# Patient Record
Sex: Male | Born: 1970 | Race: Black or African American | Hispanic: No | Marital: Married | Smoking: Never smoker
Health system: Southern US, Community
[De-identification: ages and names within clinical notes are randomized; demographics above are authoritative.]

## PROBLEM LIST (undated history)

## (undated) DIAGNOSIS — S069X9A Unspecified intracranial injury with loss of consciousness of unspecified duration, initial encounter: Secondary | ICD-10-CM

## (undated) DIAGNOSIS — S065XAA Traumatic subdural hemorrhage with loss of consciousness status unknown, initial encounter: Secondary | ICD-10-CM

## (undated) DIAGNOSIS — G060 Intracranial abscess and granuloma: Secondary | ICD-10-CM

## (undated) DIAGNOSIS — S065X9A Traumatic subdural hemorrhage with loss of consciousness of unspecified duration, initial encounter: Secondary | ICD-10-CM

## (undated) DIAGNOSIS — R51 Headache: Secondary | ICD-10-CM

## (undated) DIAGNOSIS — Z789 Other specified health status: Secondary | ICD-10-CM

## (undated) HISTORY — DX: Intracranial abscess and granuloma: G06.0

## (undated) HISTORY — PX: SHOULDER OPEN ROTATOR CUFF REPAIR: SHX2407

---

## 2008-12-15 ENCOUNTER — Emergency Department (HOSPITAL_COMMUNITY): Admission: EM | Admit: 2008-12-15 | Discharge: 2008-12-15 | Payer: Self-pay | Admitting: Emergency Medicine

## 2010-04-20 DIAGNOSIS — S069X1A Unspecified intracranial injury with loss of consciousness of 30 minutes or less, initial encounter: Secondary | ICD-10-CM

## 2010-04-20 DIAGNOSIS — S069X9A Unspecified intracranial injury with loss of consciousness of unspecified duration, initial encounter: Secondary | ICD-10-CM

## 2010-04-20 HISTORY — DX: Unspecified intracranial injury with loss of consciousness of 30 minutes or less, initial encounter: S06.9X1A

## 2010-04-20 HISTORY — DX: Unspecified intracranial injury with loss of consciousness of unspecified duration, initial encounter: S06.9X9A

## 2010-08-31 ENCOUNTER — Emergency Department (HOSPITAL_COMMUNITY): Payer: Self-pay

## 2010-08-31 ENCOUNTER — Emergency Department (HOSPITAL_COMMUNITY)
Admission: EM | Admit: 2010-08-31 | Discharge: 2010-08-31 | Payer: Self-pay | Attending: Emergency Medicine | Admitting: Emergency Medicine

## 2010-08-31 DIAGNOSIS — Y9229 Other specified public building as the place of occurrence of the external cause: Secondary | ICD-10-CM | POA: Insufficient documentation

## 2010-08-31 DIAGNOSIS — S0180XA Unspecified open wound of other part of head, initial encounter: Secondary | ICD-10-CM | POA: Insufficient documentation

## 2010-08-31 DIAGNOSIS — IMO0002 Reserved for concepts with insufficient information to code with codable children: Secondary | ICD-10-CM | POA: Insufficient documentation

## 2011-02-28 ENCOUNTER — Other Ambulatory Visit: Payer: Self-pay

## 2011-02-28 ENCOUNTER — Emergency Department (HOSPITAL_COMMUNITY)
Admission: EM | Admit: 2011-02-28 | Discharge: 2011-02-28 | Disposition: A | Discharge status: Other Acute Inpatient Hospital | Payer: 59 | Source: Home / Self Care | Attending: Emergency Medicine | Admitting: Emergency Medicine

## 2011-02-28 ENCOUNTER — Emergency Department (HOSPITAL_COMMUNITY)
Admission: EM | Admit: 2011-02-28 | Discharge: 2011-02-28 | Disposition: A | Discharge status: Home / Self Care | Payer: 59 | Attending: Emergency Medicine | Admitting: Emergency Medicine

## 2011-02-28 ENCOUNTER — Emergency Department (HOSPITAL_COMMUNITY): Payer: 59

## 2011-02-28 ENCOUNTER — Encounter (HOSPITAL_COMMUNITY): Payer: Self-pay | Admitting: *Deleted

## 2011-02-28 DIAGNOSIS — S065X9A Traumatic subdural hemorrhage with loss of consciousness of unspecified duration, initial encounter: Secondary | ICD-10-CM

## 2011-02-28 DIAGNOSIS — R51 Headache: Secondary | ICD-10-CM | POA: Insufficient documentation

## 2011-02-28 DIAGNOSIS — S065XAA Traumatic subdural hemorrhage with loss of consciousness status unknown, initial encounter: Secondary | ICD-10-CM

## 2011-02-28 DIAGNOSIS — R4701 Aphasia: Secondary | ICD-10-CM | POA: Insufficient documentation

## 2011-02-28 DIAGNOSIS — I6200 Nontraumatic subdural hemorrhage, unspecified: Secondary | ICD-10-CM | POA: Insufficient documentation

## 2011-02-28 DIAGNOSIS — I6203 Nontraumatic chronic subdural hemorrhage: Secondary | ICD-10-CM

## 2011-02-28 DIAGNOSIS — R5381 Other malaise: Secondary | ICD-10-CM | POA: Insufficient documentation

## 2011-02-28 DIAGNOSIS — R4789 Other speech disturbances: Secondary | ICD-10-CM | POA: Insufficient documentation

## 2011-02-28 DIAGNOSIS — R209 Unspecified disturbances of skin sensation: Secondary | ICD-10-CM | POA: Insufficient documentation

## 2011-02-28 DIAGNOSIS — R471 Dysarthria and anarthria: Secondary | ICD-10-CM | POA: Insufficient documentation

## 2011-02-28 LAB — CBC
MCH: 28.6 pg (ref 26.0–34.0)
MCHC: 34.8 g/dL (ref 30.0–36.0)
Platelets: 266 10*3/uL (ref 150–400)
RBC: 5.14 MIL/uL (ref 4.22–5.81)
RDW: 14 % (ref 11.5–15.5)

## 2011-02-28 LAB — COMPREHENSIVE METABOLIC PANEL
ALT: 19 U/L (ref 0–53)
AST: 31 U/L (ref 0–37)
Albumin: 4.5 g/dL (ref 3.5–5.2)
Calcium: 9.8 mg/dL (ref 8.4–10.5)
Sodium: 136 mEq/L (ref 135–145)
Total Protein: 7.5 g/dL (ref 6.0–8.3)

## 2011-02-28 MED ORDER — HYDROMORPHONE HCL PF 1 MG/ML IJ SOLN
INTRAMUSCULAR | Status: AC
  Administered 2011-02-28: 1 mg via INTRAVENOUS
  Filled 2011-02-28: qty 1

## 2011-02-28 MED ORDER — METOCLOPRAMIDE HCL 5 MG/ML IJ SOLN
10.0000 mg | Freq: Once | INTRAMUSCULAR | Status: AC
  Administered 2011-02-28: 10 mg via INTRAMUSCULAR
  Filled 2011-02-28: qty 2

## 2011-02-28 MED ORDER — OXYCODONE-ACETAMINOPHEN 5-325 MG PO TABS: 1.0000 | ORAL_TABLET | ORAL | Status: DC | PRN

## 2011-02-28 MED ORDER — OXYCODONE-ACETAMINOPHEN 5-325 MG PO TABS
1.0000 | ORAL_TABLET | Freq: Once | ORAL | Status: AC
Start: 2011-02-28 — End: 2011-02-28
  Administered 2011-02-28: 1 via ORAL
  Filled 2011-02-28: qty 1

## 2011-02-28 MED ORDER — HYDROMORPHONE HCL PF 1 MG/ML IJ SOLN
1.0000 mg | Freq: Once | INTRAMUSCULAR | Status: AC
  Administered 2011-02-28: 1 mg via INTRAVENOUS
  Filled 2011-02-28: qty 1

## 2011-02-28 MED ORDER — HYDROMORPHONE HCL PF 1 MG/ML IJ SOLN
1.0000 mg | Freq: Once | INTRAMUSCULAR | Status: AC
  Administered 2011-02-28: 1 mg via INTRAVENOUS

## 2011-02-28 NOTE — ED Notes (Signed)
[facility] at bedside. Pt remains a/o x 4, no neuro deficits. C/o right sided headache 8/10, MD aware

## 2011-02-28 NOTE — ED Notes (Addendum)
Trans from [employer]

## 2011-02-28 NOTE — Consult Note (Signed)
Reason for Consult: Headache right-sided tingling Referring Physician: Dr. Bednar  Don Palmer is an 39 y.o. male.  HPI: Patient is a 39-year-old male whose had two-month history of headaches. He went to urgent care and was diagnosed with migraine. Those medicines that did not help much he is continued with headaches worse with activity. Today had episode of right-sided arm and face tingling it is slight weakness. This resolved he presented to the emergency room CT scan of the head was done. CT showed left-sided subdural hematoma and patient transferred home for [facility] further care. Patient's no complaints at the moment.  PMH:negative   surg Hx - L rotator cuff  Family History: No pertinent family history.   Social History:  reports that he has never smoked. He does not have any smokeless tobacco history on file. He reports that he drinks alcohol. He reports that he does not use illicit drugs.  Allergies: No Known Allergies  Review of Systems  Constitutional: Negative for fever.  10 Systems reviewed and are negative for acute change except as noted in the HPI.  HENT: Negative for congestion.  Eyes: Negative for discharge and redness.  Respiratory: Negative for cough and shortness of breath.  Cardiovascular: Negative for chest pain.  Gastrointestinal: Negative for vomiting and abdominal pain.  Musculoskeletal: Negative for back pain.  Skin: Negative for rash.  Neurological: Positive for speech difficulty, weakness, numbness and headaches. Negative for syncope.  Psychiatric/Behavioral:  No behavior change.    Medications:  .  KETOROLAC TROMETHAMINE 10 MG PO TABS  Oral  Take 10 mg by mouth every 6 (six) hours as needed. For migraines    Results for orders placed during the hospital encounter of 02/28/11 (from the past 48 hour(s))  CBC     Status: Normal   Collection Time   02/28/11  4:26 PM      Component Value Range Comment   WBC 8.6  4.0 - 10.5 (K/uL)    RBC 5.14  4.22 - 5.81 (MIL/uL)    Hemoglobin 14.7  13.0 - 17.0 (g/dL)    HCT 42.2  39.0 - 52.0 (%)    MCV 82.1  78.0 - 100.0 (fL)    MCH 28.6  26.0 - 34.0 (pg)    MCHC 34.8  30.0 - 36.0 (g/dL)    RDW 14.0  11.5 - 15.5 (%)    Platelets 266  150 - 400 (K/uL)   COMPREHENSIVE METABOLIC PANEL     Status: Abnormal   Collection Time   02/28/11  4:26 PM      Component Value Range Comment   Sodium 136  135 - 145 (mEq/L)    Potassium 3.0 (*) 3.5 - 5.1 (mEq/L)    Chloride 100  96 - 112 (mEq/L)    CO2 27  19 - 32 (mEq/L)    Glucose, Bld 83  70 - 99 (mg/dL)    BUN 13  6 - 23 (mg/dL)    Creatinine, Ser 1.17  0.50 - 1.35 (mg/dL)    Calcium 9.8  8.4 - 10.5 (mg/dL)    Total Protein 7.5  6.0 - 8.3 (g/dL)    Albumin 4.5  3.5 - 5.2 (g/dL)    AST 31  0 - 37 (U/L)    ALT 19  0 - 53 (U/L)    Alkaline Phosphatase 57  39 - 117 (U/L)    Total Bilirubin 0.4  0.3 - 1.2 (mg/dL)    GFR calc non Af Amer 76 (*) >  90 (mL/min)    GFR calc Af Amer 88 (*) >90 (mL/min)   PROTIME-INR     Status: Normal   Collection Time   02/28/11  4:26 PM      Component Value Range Comment   Prothrombin Time 13.4  11.6 - 15.2 (seconds)    INR 1.00  0.00 - 1.49      Ct Head Wo Contrast  02/28/2011  *RADIOLOGY REPORT*  Clinical Data: Dysarthria, weakness  CT HEAD WITHOUT CONTRAST  Technique:  Contiguous axial images were obtained from the base of the skull through the vertex without contrast.  Comparison: CT 08/31/2010  Findings: There is a large intermediate density extra-axial fluid collection extending along the left cerebral convexity from the frontal lobe to the parietal lobe.  This fluid collection is 1.5 cm in greatest depth and is approximately isointense to gray matter. There is mass effect upon the left frontal lobe with effacement of the cortex and compression of the cortex.  There is significant rightward midline shift of 11 mm.  The left lateral ventricle is compressed.  No significant hydrocephalus.  The quadrigeminal  plates are mildly effaced.  The fourth ventricle is patent.  The suprasellar cistern is patent.  No evidence of skull fracture.  Paranasal sinuses mastoid air cells are clear.  IMPRESSION:  1.  Large intermediate density extra-axial fluid collection is most consistent with a subacute subdural hematoma.   2. Significant mass effect with compression of the left frontal lobe and 10 mm of rightward  midline shift.  Findings conveyed to Dr. Bender on 02/28/2011 at 1635 hours  Original Report Authenticated By: STEWART EDMUNDS, M.D.    PE Blood pressure 104/73, pulse 69, resp. rate 16, height 5' 7" (1.702 m), weight 83.915 kg (185 lb), SpO2 99.00%. Patient awake alert oriented x3. Normal fund of knowledge. Speech fluent. Cranial nerves II through XII intact. Motor strength shows 5 over 5 strength in all motor groups. Sensation intact throughout. No pronator drift. Deep tendon reflexes symmetric. No sensory extinction.  Neck is supple nontender. Head normocephalic. Lungs clear abdomen soft nontender extremities intact.   Assessment/Plan: Patient with large subacute subdural hematoma in the left. Patient is neurologically intact. We have planned on having surgery Monday morning.left craniotomy evacuation of subdural hematoma. Patient wishes to be discharged home and within the neurologically intact at this point I think it is fine and readmitted Monday for surgery.  Andreyah Natividad R, MD 02/28/2011, 8:00 PM   

## 2011-02-28 NOTE — ED Provider Notes (Signed)
History     CSN: [id number] Arrival date & time: 02/28/2011  3:07 PM   First MD Initiated Contact with Patient 02/28/11 1600      Chief Complaint  Patient presents with  . Headache  . Aphasia    pt in with h.a x 2 months with blurred vision and aphasia x 1 hr , states right hand numbness    (Consider location/radiation/quality/duration/timing/severity/associated sxs/prior treatment) HPI This 40 year old male has had a gradual onset of headache on and off for the last few weeks. A few weeks ago he had a gradual onset of a headache the last week and then was gone for a week. The headache returned for about a week and then resolved as well. He has been headache free for several days but now today has a gradual onset about an hour prior to arrival to the hospital with a headache again as well as transient 20 minute spell of right-sided arm and leg numbness and weakness which is now resolved completely. He also had transient speech difficulty for about 20 minutes prior to arrival today as well. He was last seen normal 1 hour prior to arrival that he had focal neurologic symptoms temporarily for 20 minutes then it does resolve completely prior to arrival. Upon arrival he now is headache free as his headache completely resolved prior to arrival. There is no change in swallowing or understanding or vision.  He is not a candidate for TPA and stroke upon arrival as his symptoms resolved completely prior to arrival. History reviewed. No pertinent past medical history.  History reviewed. No pertinent past surgical history.  History reviewed. No pertinent family history.  History  Substance Use Topics  . Smoking status: Never Smoker   . Smokeless tobacco: Not on file  . Alcohol Use: Yes      Review of Systems  Constitutional: Negative for fever.       10 Systems reviewed and are negative for acute change except as noted in the HPI.  HENT: Negative for congestion.   Eyes: Negative for  discharge and redness.  Respiratory: Negative for cough and shortness of breath.   Cardiovascular: Negative for chest pain.  Gastrointestinal: Negative for vomiting and abdominal pain.  Musculoskeletal: Negative for back pain.  Skin: Negative for rash.  Neurological: Positive for speech difficulty, weakness, numbness and headaches. Negative for syncope.  Psychiatric/Behavioral:       No behavior change.    Allergies  Review of patient's allergies indicates no known allergies.  Home Medications   Current Outpatient Rx  Name Route Sig Dispense Refill  . KETOROLAC TROMETHAMINE 10 MG PO TABS Oral Take 10 mg by mouth every 6 (six) hours as needed. For migraines       BP 142/65  Pulse 68  Temp(Src) 98.8 F (37.1 C) (Oral)  Resp 18  SpO2 99%  Physical Exam  Nursing note and vitals reviewed. Constitutional: He appears well-developed.       Awake, alert, nontoxic appearance with baseline speech for patient.  HENT:  Head: Atraumatic.  Mouth/Throat: No oropharyngeal exudate.  Eyes: EOM are normal. Pupils are equal, round, and reactive to light. Right eye exhibits no discharge. Left eye exhibits no discharge.  Neck: Neck supple.  Cardiovascular: Normal rate and regular rhythm.   No murmur heard. Pulmonary/Chest: Effort normal and breath sounds normal. No stridor. No respiratory distress. He has no wheezes. He has no rales. He exhibits no tenderness.  Abdominal: Soft. Bowel sounds are normal. He exhibits no  mass. There is no tenderness. There is no rebound.  Musculoskeletal: Normal range of motion. He exhibits no edema and no tenderness.       Baseline ROM, moves extremities with no obvious new focal weakness.  Lymphadenopathy:    He has no cervical adenopathy.  Neurological: He is alert.       Awake, alert, cooperative and aware of situation; motor strength bilaterally; sensation normal to light touch bilaterally; peripheral visual fields full to confrontation; no facial asymmetry;  tongue midline; major cranial nerves appear intact; no pronator drift, normal finger to nose bilaterally  Skin: No rash noted.  Psychiatric: He has a normal mood and affect.    ED Course  Procedures (including critical care time)  ECG: Sinus rhythm, ventricular rate 84 beats per minute, normal axis, normal intervals, nonspecific T wave changes present, no comparison ECG available.  D/w NSurg for transfer to [facility].  Pt stable in ED with no significant deterioration in condition.Patient / Family / Caregiver informed of clinical course, understand medical decision-making process, and agree with plan.  CRITICAL CARE Performed by: Hurman Horn   Total critical care time: 40  Critical care time was exclusive of separately billable procedures and treating other patients.  Critical care was necessary to treat or prevent imminent or life-threatening deterioration.  Critical care was time spent personally by me on the following activities: development of treatment plan with patient and/or surrogate as well as nursing, discussions with consultants, evaluation of patient's response to treatment, examination of patient, obtaining history from patient or surrogate, ordering and performing treatments and interventions, ordering and review of laboratory studies, ordering and review of radiographic studies, pulse oximetry and re-evaluation of patient's condition.  Labs Reviewed  CBC  PROTIME-INR  COMPREHENSIVE METABOLIC PANEL   Ct Head Wo Contrast  02/28/2011  *RADIOLOGY REPORT*  Clinical Data: Dysarthria, weakness  CT HEAD WITHOUT CONTRAST  Technique:  Contiguous axial images were obtained from the base of the skull through the vertex without contrast.  Comparison: CT 08/31/2010  Findings: There is a large intermediate density extra-axial fluid collection extending along the left cerebral convexity from the frontal lobe to the parietal lobe.  This fluid collection is 1.5 cm in greatest depth and is  approximately isointense to gray matter. There is mass effect upon the left frontal lobe with effacement of the cortex and compression of the cortex.  There is significant rightward midline shift of 11 mm.  The left lateral ventricle is compressed.  No significant hydrocephalus.  The quadrigeminal plates are mildly effaced.  The fourth ventricle is patent.  The suprasellar cistern is patent.  No evidence of skull fracture.  Paranasal sinuses mastoid air cells are clear.  IMPRESSION:  1.  Large intermediate density extra-axial fluid collection is most consistent with a subacute subdural hematoma.   2. Significant mass effect with compression of the left frontal lobe and 10 mm of rightward  midline shift.  Findings conveyed to Dr. Hessie Diener on 02/28/2011 at 1635 hours  Original Report Authenticated By: Genevive Bi, M.D.     1. Subdural hematoma       MDM  The patient appears reasonably stabilized for transfer considering the current resources, flow, and capabilities available in the ED at this time, and I doubt any other [facility] requiring further screening and/or treatment in the ED prior to transfer.        Hurman Horn, MD 03/01/11 [phone]

## 2011-02-28 NOTE — ED Notes (Signed)
Dr Fonnie Jarvis at bedside, pt a/o x 4, very restless, reassurance offered to pt. Wife at bedside. Dr Fonnie Jarvis updated pt and wife on CT scan results

## 2011-02-28 NOTE — ED Notes (Signed)
Advised RN at [facility] ED of pt's pending arrival.

## 2011-02-28 NOTE — ED Notes (Signed)
Pt transported to and from CT scan via stretcher with tech  

## 2011-02-28 NOTE — ED Provider Notes (Signed)
Pt seen by [name] Will have surgery Monday He is well appearing, no distress, watching TV He understands to come back to hospital   Date: 02/28/2011  Rate: 73  Rhythm: normal sinus rhythm  QRS Axis: normal  Intervals: normal  ST/T Wave abnormalities: nonspecific ST changes  Conduction Disutrbances:none  Narrative Interpretation:   Old EKG Reviewed: none available    Joya Gaskins, MD 02/28/11 2211

## 2011-03-02 ENCOUNTER — Encounter (HOSPITAL_COMMUNITY): Payer: Self-pay | Admitting: *Deleted

## 2011-03-02 ENCOUNTER — Encounter (HOSPITAL_COMMUNITY): Payer: Self-pay | Admitting: Certified Registered"

## 2011-03-02 ENCOUNTER — Encounter (HOSPITAL_COMMUNITY)
Admission: RE | Disposition: A | Discharge status: Home / Self Care | Payer: Self-pay | Source: Ambulatory Visit | Attending: Neurosurgery

## 2011-03-02 ENCOUNTER — Other Ambulatory Visit: Payer: Self-pay | Admitting: Neurosurgery

## 2011-03-02 ENCOUNTER — Inpatient Hospital Stay (HOSPITAL_COMMUNITY)
Admission: RE | Admit: 2011-03-02 | Discharge: 2011-03-05 | DRG: 027 | Disposition: A | Discharge status: Home / Self Care | Payer: 59 | Source: Ambulatory Visit | Attending: Neurosurgery | Admitting: Neurosurgery

## 2011-03-02 ENCOUNTER — Encounter (HOSPITAL_COMMUNITY): Payer: Self-pay | Admitting: Neurology

## 2011-03-02 ENCOUNTER — Inpatient Hospital Stay (HOSPITAL_COMMUNITY): Payer: 59 | Admitting: Certified Registered"

## 2011-03-02 DIAGNOSIS — I6200 Nontraumatic subdural hemorrhage, unspecified: Principal | ICD-10-CM | POA: Diagnosis present

## 2011-03-02 DIAGNOSIS — S065X9A Traumatic subdural hemorrhage with loss of consciousness of unspecified duration, initial encounter: Secondary | ICD-10-CM

## 2011-03-02 HISTORY — PX: CRANIOTOMY: SHX93

## 2011-03-02 HISTORY — DX: Other specified health status: Z78.9

## 2011-03-02 HISTORY — DX: Headache: R51

## 2011-03-02 LAB — TYPE AND SCREEN: Antibody Screen: NEGATIVE

## 2011-03-02 LAB — ABO/RH: ABO/RH(D): O POS

## 2011-03-02 LAB — BASIC METABOLIC PANEL
BUN: 10 mg/dL (ref 6–23)
Chloride: 104 mEq/L (ref 96–112)
GFR calc Af Amer: 68 mL/min — ABNORMAL LOW (ref >90)
GFR calc non Af Amer: 59 mL/min — ABNORMAL LOW (ref >90)
Glucose, Bld: 133 mg/dL — ABNORMAL HIGH (ref 70–99)
Potassium: 4.2 mEq/L (ref 3.5–5.1)
Sodium: 140 mEq/L (ref 135–145)

## 2011-03-02 LAB — SURGICAL PCR SCREEN: Staphylococcus aureus: NEGATIVE

## 2011-03-02 SURGERY — CRANIOTOMY HEMATOMA EVACUATION SUBDURAL
Anesthesia: General | Laterality: Left | Wound class: Clean

## 2011-03-02 MED ORDER — ONDANSETRON HCL 4 MG/2ML IJ SOLN: 4.0000 mg | INTRAMUSCULAR | Status: DC | PRN

## 2011-03-02 MED ORDER — PHENOL 1.4 % MT LIQD: 1.0000 | OROMUCOSAL | Status: DC | PRN

## 2011-03-02 MED ORDER — PROPOFOL 10 MG/ML IV EMUL
INTRAVENOUS | Status: DC | PRN
  Administered 2011-03-02: 280 mg via INTRAVENOUS

## 2011-03-02 MED ORDER — SUFENTANIL CITRATE 50 MCG/ML IV SOLN
INTRAVENOUS | Status: DC | PRN
  Administered 2011-03-02: 20 ug via INTRAVENOUS
  Administered 2011-03-02: 10 ug via INTRAVENOUS
  Administered 2011-03-02: 20 ug via INTRAVENOUS

## 2011-03-02 MED ORDER — ALUM & MAG HYDROXIDE-SIMETH 400-400-40 MG/5ML PO SUSP
30.0000 mL | Freq: Four times a day (QID) | ORAL | Status: DC | PRN
  Filled 2011-03-02: qty 30

## 2011-03-02 MED ORDER — THROMBIN 20000 UNITS EX KIT
PACK | CUTANEOUS | Status: DC | PRN
  Administered 2011-03-02: 10:00:00 via TOPICAL

## 2011-03-02 MED ORDER — ONDANSETRON HCL 4 MG/2ML IJ SOLN: 4.0000 mg | Freq: Four times a day (QID) | INTRAMUSCULAR | Status: DC | PRN

## 2011-03-02 MED ORDER — CEFAZOLIN SODIUM 1-5 GM-% IV SOLN
1.0000 g | Freq: Three times a day (TID) | INTRAVENOUS | Status: DC
  Administered 2011-03-02 – 2011-03-04 (×7): 1 g via INTRAVENOUS
  Filled 2011-03-02 (×8): qty 50

## 2011-03-02 MED ORDER — LACTATED RINGERS IV SOLN
INTRAVENOUS | Status: DC | PRN
  Administered 2011-03-02 (×2): via INTRAVENOUS

## 2011-03-02 MED ORDER — ONDANSETRON HCL 4 MG/2ML IJ SOLN
INTRAMUSCULAR | Status: DC | PRN
  Administered 2011-03-02: 4 mg via INTRAVENOUS

## 2011-03-02 MED ORDER — ROCURONIUM BROMIDE 100 MG/10ML IV SOLN
INTRAVENOUS | Status: DC | PRN
  Administered 2011-03-02: 50 mg via INTRAVENOUS

## 2011-03-02 MED ORDER — LIDOCAINE-EPINEPHRINE 1 %-1:100000 IJ SOLN
INTRAMUSCULAR | Status: DC | PRN
  Administered 2011-03-02: 20 mL

## 2011-03-02 MED ORDER — KCL IN DEXTROSE-NACL 20-5-0.45 MEQ/L-%-% IV SOLN
INTRAVENOUS | Status: DC
  Administered 2011-03-02 – 2011-03-03 (×4): via INTRAVENOUS
  Filled 2011-03-02 (×6): qty 1000

## 2011-03-02 MED ORDER — SODIUM CHLORIDE 0.9 % IR SOLN
Status: DC | PRN
  Administered 2011-03-02: 10:00:00

## 2011-03-02 MED ORDER — MUPIROCIN 2 % EX OINT
TOPICAL_OINTMENT | Freq: Two times a day (BID) | CUTANEOUS | Status: DC
  Administered 2011-03-02 – 2011-03-04 (×3): via NASAL
  Filled 2011-03-02: qty 22

## 2011-03-02 MED ORDER — ACETAMINOPHEN 325 MG PO TABS: 650.0000 mg | ORAL_TABLET | ORAL | Status: DC | PRN

## 2011-03-02 MED ORDER — SODIUM CHLORIDE 0.9 % IR SOLN
Status: DC | PRN
  Administered 2011-03-02: 2000 mL

## 2011-03-02 MED ORDER — DOCUSATE SODIUM 100 MG PO CAPS
100.0000 mg | ORAL_CAPSULE | Freq: Two times a day (BID) | ORAL | Status: DC
  Administered 2011-03-02 – 2011-03-04 (×5): 100 mg via ORAL
  Filled 2011-03-02 (×8): qty 1

## 2011-03-02 MED ORDER — DEXAMETHASONE SODIUM PHOSPHATE 4 MG/ML IJ SOLN
INTRAMUSCULAR | Status: DC | PRN
  Administered 2011-03-02: 4 mg via INTRAVENOUS

## 2011-03-02 MED ORDER — CEFAZOLIN SODIUM-DEXTROSE 2-3 GM-% IV SOLR
2.0000 g | INTRAVENOUS | Status: AC
  Administered 2011-03-02: 2 g via INTRAVENOUS
  Filled 2011-03-02: qty 50

## 2011-03-02 MED ORDER — LABETALOL HCL 5 MG/ML IV SOLN
5.0000 mg | INTRAVENOUS | Status: DC | PRN
Start: 2011-03-02 — End: 2011-03-04

## 2011-03-02 MED ORDER — HYDROMORPHONE HCL PF 1 MG/ML IJ SOLN: 0.2500 mg | INTRAMUSCULAR | Status: DC | PRN

## 2011-03-02 MED ORDER — MORPHINE SULFATE 4 MG/ML IJ SOLN: 1.0000 mg | INTRAMUSCULAR | Status: DC | PRN

## 2011-03-02 MED ORDER — ACETAMINOPHEN 650 MG RE SUPP: 650.0000 mg | RECTAL | Status: DC | PRN

## 2011-03-02 MED ORDER — MIDAZOLAM HCL 5 MG/5ML IJ SOLN
INTRAMUSCULAR | Status: DC | PRN
  Administered 2011-03-02: 2 mg via INTRAVENOUS

## 2011-03-02 MED ORDER — BACITRACIN ZINC 500 UNIT/GM EX OINT
TOPICAL_OINTMENT | CUTANEOUS | Status: DC | PRN
  Administered 2011-03-02: 1 via TOPICAL

## 2011-03-02 MED ORDER — HYDROCODONE-ACETAMINOPHEN 5-325 MG PO TABS
1.0000 | ORAL_TABLET | ORAL | Status: DC | PRN
  Administered 2011-03-02 (×2): 1 via ORAL
  Administered 2011-03-03 (×4): 2 via ORAL
  Administered 2011-03-03: 1 via ORAL
  Administered 2011-03-04: 2 via ORAL
  Filled 2011-03-02: qty 2
  Filled 2011-03-02: qty 1
  Filled 2011-03-02: qty 2
  Filled 2011-03-02: qty 1
  Filled 2011-03-02 (×5): qty 2

## 2011-03-02 MED ORDER — MENTHOL 3 MG MT LOZG: 1.0000 | LOZENGE | OROMUCOSAL | Status: DC | PRN

## 2011-03-02 MED ORDER — PROMETHAZINE HCL 25 MG/ML IJ SOLN
12.5000 mg | INTRAMUSCULAR | Status: DC | PRN
  Filled 2011-03-02: qty 1

## 2011-03-02 SURGICAL SUPPLY — 45 items
BAG DECANTER FOR FLEXI CONT (MISCELLANEOUS) ×2 IMPLANT
BENZOIN TINCTURE PRP APPL 2/3 (GAUZE/BANDAGES/DRESSINGS) ×2 IMPLANT
BRUSH SCRUB EZ PLAIN DRY (MISCELLANEOUS) ×2 IMPLANT
BUR ACORN 6.0 PRECISION (BURR) ×2 IMPLANT
BUR ROUTER D-58 CRANI (BURR) ×2 IMPLANT
CANISTER SUCTION 2500CC (MISCELLANEOUS) ×2 IMPLANT
CLOTH BEACON ORANGE TIMEOUT ST (SAFETY) ×2 IMPLANT
CONT SPEC 4OZ CLIKSEAL STRL BL (MISCELLANEOUS) ×4 IMPLANT
CORDS BIPOLAR (ELECTRODE) ×2 IMPLANT
DRAIN JACKSON PRATT 10MM FLAT (MISCELLANEOUS) ×2 IMPLANT
DRAPE NEUROLOGICAL W/INCISE (DRAPES) ×2 IMPLANT
DRAPE WARM FLUID 44X44 (DRAPE) ×2 IMPLANT
DRESSING TELFA 8X3 (GAUZE/BANDAGES/DRESSINGS) ×2 IMPLANT
DRSG OPSITE 4X5.5 SM (GAUZE/BANDAGES/DRESSINGS) ×4 IMPLANT
ELECT CAUTERY BLADE 6.4 (BLADE) ×2 IMPLANT
ELECT NEEDLE TIP 2.8 STRL (NEEDLE) ×2 IMPLANT
ELECT REM PT RETURN 9FT ADLT (ELECTROSURGICAL) ×2
ELECTRODE REM PT RTRN 9FT ADLT (ELECTROSURGICAL) ×1 IMPLANT
EVACUATOR SILICONE 100CC (DRAIN) ×2 IMPLANT
GLOVE ECLIPSE 7.5 STRL STRAW (GLOVE) ×4 IMPLANT
GOWN BRE IMP SLV AUR LG STRL (GOWN DISPOSABLE) ×2 IMPLANT
GOWN BRE IMP SLV AUR XL STRL (GOWN DISPOSABLE) ×2 IMPLANT
GOWN STRL REIN 2XL LVL4 (GOWN DISPOSABLE) ×2 IMPLANT
HOOK DURA (MISCELLANEOUS) ×2 IMPLANT
KIT BASIN OR (CUSTOM PROCEDURE TRAY) ×2 IMPLANT
KIT ROOM TURNOVER OR (KITS) ×2 IMPLANT
NEEDLE HYPO 22GX1.5 SAFETY (NEEDLE) ×2 IMPLANT
NS IRRIG 1000ML POUR BTL (IV SOLUTION) ×10 IMPLANT
PACK CRANIOTOMY (CUSTOM PROCEDURE TRAY) ×2 IMPLANT
PAD ARMBOARD 7.5X6 YLW CONV (MISCELLANEOUS) ×4 IMPLANT
PATTIES SURGICAL .75X.75 (GAUZE/BANDAGES/DRESSINGS) ×2 IMPLANT
PLATE 1.5  2HOLE MED NEURO (Plate) ×1 IMPLANT
PLATE 1.5 2HOLE MED NEURO (Plate) ×1 IMPLANT
PLATE 1.5 5HOLE SQUARE (Plate) ×4 IMPLANT
SCREW SELF DRILL HT 1.5/4MM (Screw) ×20 IMPLANT
STAPLER SKIN PROX WIDE 3.9 (STAPLE) ×2 IMPLANT
SUT ETHILON 3 0 FSL (SUTURE) ×2 IMPLANT
SUT NURALON 4 0 TR CR/8 (SUTURE) ×4 IMPLANT
SUT VIC AB 2-0 CP2 18 (SUTURE) ×4 IMPLANT
SYR 20ML ECCENTRIC (SYRINGE) ×2 IMPLANT
SYR CONTROL 10ML LL (SYRINGE) ×2 IMPLANT
TOWEL OR 17X24 6PK STRL BLUE (TOWEL DISPOSABLE) ×2 IMPLANT
TOWEL OR 17X26 10 PK STRL BLUE (TOWEL DISPOSABLE) ×2 IMPLANT
UNDERPAD 30X30 INCONTINENT (UNDERPADS AND DIAPERS) ×2 IMPLANT
WATER STERILE IRR 1000ML POUR (IV SOLUTION) ×2 IMPLANT

## 2011-03-02 NOTE — Anesthesia Preprocedure Evaluation (Addendum)
Anesthesia Evaluation  Patient identified by MRN, date of birth, ID band Patient awake    Reviewed: Allergy & Precautions, H&P , NPO status , Patient's Chart, lab work & pertinent test results  Airway Mallampati: II  Neck ROM: full    Dental   Pulmonary          Cardiovascular     Neuro/Psych  Headaches,    GI/Hepatic   Endo/Other    Renal/GU      Musculoskeletal   Abdominal   Peds  Hematology   Anesthesia Other Findings   Reproductive/Obstetrics                          Anesthesia Physical Anesthesia Plan  ASA: II  Anesthesia Plan: General   Post-op Pain Management:    Induction: Intravenous  Airway Management Planned: Oral ETT  Additional Equipment:   Intra-op Plan:   Post-operative Plan:   Informed Consent: I have reviewed the patients History and Physical, chart, labs and discussed the procedure including the risks, benefits and alternatives for the proposed anesthesia with the patient or authorized representative who has indicated his/her understanding and acceptance.   Dental advisory given  Plan Discussed with: CRNA and Surgeon  Anesthesia Plan Comments:        Anesthesia Quick Evaluation

## 2011-03-02 NOTE — Op Note (Signed)
03/02/2011  11:36 AM  PATIENT:  Don Palmer  40 y.o. male  PRE-OPERATIVE DIAGNOSIS:  left subdural hematoma  POST-OPERATIVE DIAGNOSIS:  left subdural hematoma  PROCEDURE:  Procedure(s): CRANIOTOMY HEMATOMA EVACUATION SUBDURAL  SURGEON:  Surgeon(s): Bing Plume  ASSISTANTS:   ANESTHESIA:   general  EBL:  Total I/O In: 1025 [I.V.:1025] Out: -   BLOOD ADMINISTERED:none  DRAINS: () Jackson-Pratt drain(s) with closed bulb suction in the epidural/subdural space   SPECIMEN:  Source of Specimen:  subdural hematoma  DICTATION: Patient was brought to the operating room. Gen. anesthesia was induced. Patient was placed in [place] head pins in position. Patient was prepped draped in a sterile fashion. The site of incision was injected with 20 cc 1% lidocaine with epinephrine. An incision was made over the left side of the scalp. Using the needle-tip Bovie incision taken down to the skull. We exposed the skull self-retaining retractors. A burr hole was drilled in the anterior inferior area. Craniotome used to elevate a bone flap. The pocket sutures were then placed using 4-0 Nurolon. Dura was opened with a 15 blade. Dark thick oil like fluid was evacuated. We then removed some subdural membranes some subdural clot sent this to pathology. We continued evacuated and the subdural using irrigation. We were able  to remove the inner membrane and then the brain was pulsatile after this. We had good hemostasis. Dura was closed with 4-0 Nurolon sutures. A drain was placed a separate stab incision. The drain was in the epidural space and anteriorly into the subdural space. Holes were placed and the dura under the drain so it could drain any subdural fluid. Bone was put back into position and held in place with the microplating system and screws. The galea was closed with 2-0 Vicryl interrupted sutures. Skin closed staples. The drain was sutured in with a nylon suture and connected  to bulb suction. Dressing was placed patient was removed from the head pins (and transferred to recovery in stable condition.  PLAN OF CARE: Admit to inpatient   PATIENT DISPOSITION:  ICU - extubated and stable.

## 2011-03-02 NOTE — Anesthesia Preprocedure Evaluation (Deleted)
Anesthesia Evaluation Anesthesia Physical Anesthesia Plan Anesthesia Quick Evaluation  

## 2011-03-02 NOTE — H&P (View-Only) (Signed)
Reason for Consult: Headache right-sided tingling Referring Physician: Dr. Leonette Monarch is an 40 y.o. male.  HPI: Patient is a 40 year old male whose had two-month history of headaches. He went to urgent care and was diagnosed with migraine. Those medicines that did not help much he is continued with headaches worse with activity. Today had episode of right-sided arm and face tingling it is slight weakness. This resolved he presented to the emergency room CT scan of the head was done. CT showed left-sided subdural hematoma and patient transferred home for [facility] further care. Patient's no complaints at the moment.  [id number]   surg Hx - L rotator cuff  Family History: No pertinent family history.   Social History:  reports that he has never smoked. He does not have any smokeless tobacco history on file. He reports that he drinks alcohol. He reports that he does not use illicit drugs.  Allergies: No Known Allergies  Review of Systems  Constitutional: Negative for fever.  10 Systems reviewed and are negative for acute change except as noted in the HPI.  HENT: Negative for congestion.  Eyes: Negative for discharge and redness.  Respiratory: Negative for cough and shortness of breath.  Cardiovascular: Negative for chest pain.  Gastrointestinal: Negative for vomiting and abdominal pain.  Musculoskeletal: Negative for back pain.  Skin: Negative for rash.  Neurological: Positive for speech difficulty, weakness, numbness and headaches. Negative for syncope.  Psychiatric/Behavioral:  No behavior change.    Medications:  .  KETOROLAC TROMETHAMINE 10 MG PO TABS  Oral  Take 10 mg by mouth every 6 (six) hours as needed. For migraines    Results for orders placed during the hospital encounter of 02/28/11 (from the past 48 hour(s))  CBC     Status: Normal   Collection Time   02/28/11  4:26 PM      Component Value Range Comment   WBC 8.6  4.0 - 10.5 (K/uL)    RBC 5.14  4.22 - 5.81 (MIL/uL)    Hemoglobin 14.7  13.0 - 17.0 (g/dL)    HCT 98.1  19.1 - 47.8 (%)    MCV 82.1  78.0 - 100.0 (fL)    MCH 28.6  26.0 - 34.0 (pg)    MCHC 34.8  30.0 - 36.0 (g/dL)    RDW 29.5  62.1 - 30.8 (%)    Platelets 266  150 - 400 (K/uL)   COMPREHENSIVE METABOLIC PANEL     Status: Abnormal   Collection Time   02/28/11  4:26 PM      Component Value Range Comment   Sodium 136  135 - 145 (mEq/L)    Potassium 3.0 (*) 3.5 - 5.1 (mEq/L)    Chloride 100  96 - 112 (mEq/L)    CO2 27  19 - 32 (mEq/L)    Glucose, Bld 83  70 - 99 (mg/dL)    BUN 13  6 - 23 (mg/dL)    Creatinine, Ser 6.57  0.50 - 1.35 (mg/dL)    Calcium 9.8  8.4 - 10.5 (mg/dL)    Total Protein 7.5  6.0 - 8.3 (g/dL)    Albumin 4.5  3.5 - 5.2 (g/dL)    AST 31  0 - 37 (U/L)    ALT 19  0 - 53 (U/L)    Alkaline Phosphatase 57  39 - 117 (U/L)    Total Bilirubin 0.4  0.3 - 1.2 (mg/dL)    GFR calc non Af Amer 76 (*) >  90 (mL/min)    GFR calc Af Amer 88 (*) >90 (mL/min)   PROTIME-INR     Status: Normal   Collection Time   02/28/11  4:26 PM      Component Value Range Comment   Prothrombin Time 13.4  11.6 - 15.2 (seconds)    INR 1.00  0.00 - 1.49      Ct Head Wo Contrast  02/28/2011  *RADIOLOGY REPORT*  Clinical Data: Dysarthria, weakness  CT HEAD WITHOUT CONTRAST  Technique:  Contiguous axial images were obtained from the base of the skull through the vertex without contrast.  Comparison: CT 08/31/2010  Findings: There is a large intermediate density extra-axial fluid collection extending along the left cerebral convexity from the frontal lobe to the parietal lobe.  This fluid collection is 1.5 cm in greatest depth and is approximately isointense to gray matter. There is mass effect upon the left frontal lobe with effacement of the cortex and compression of the cortex.  There is significant rightward midline shift of 11 mm.  The left lateral ventricle is compressed.  No significant hydrocephalus.  The quadrigeminal  plates are mildly effaced.  The fourth ventricle is patent.  The suprasellar cistern is patent.  No evidence of skull fracture.  Paranasal sinuses mastoid air cells are clear.  IMPRESSION:  1.  Large intermediate density extra-axial fluid collection is most consistent with a subacute subdural hematoma.   2. Significant mass effect with compression of the left frontal lobe and 10 mm of rightward  midline shift.  Findings conveyed to Dr. Hessie Diener on 02/28/2011 at 1635 hours  Original Report Authenticated By: Genevive Bi, M.D.    PE Blood pressure 104/73, pulse 69, resp. rate 16, height 5\' 7"  (1.702 m), weight 83.915 kg (185 lb), SpO2 99.00%. Patient awake alert oriented x3. Normal fund of knowledge. Speech fluent. Cranial nerves II through XII intact. Motor strength shows 5 over 5 strength in all motor groups. Sensation intact throughout. No pronator drift. Deep tendon reflexes symmetric. No sensory extinction.  Neck is supple nontender. Head normocephalic. Lungs clear abdomen soft nontender extremities intact.   Assessment/Plan: Patient with large subacute subdural hematoma in the left. Patient is neurologically intact. We have planned on having surgery Monday morning.left craniotomy evacuation of subdural hematoma. Patient wishes to be discharged home and within the neurologically intact at this point I think it is fine and readmitted Monday for surgery.  Clydene Fake, MD 02/28/2011, 8:00 PM

## 2011-03-02 NOTE — Transfer of Care (Signed)
Immediate Anesthesia Transfer of Care Note  Patient: Don Palmer  Procedure(s) Performed:  CRANIOTOMY HEMATOMA EVACUATION SUBDURAL  Patient Location: PACU  Anesthesia Type: General  Level of Consciousness: awake, sedated and patient cooperative  Airway & Oxygen Therapy: Patient Spontanous Breathing and Patient connected to nasal cannula oxygen  Post-op Assessment: Report given to PACU RN, Post -op Vital signs reviewed and stable and Patient moving all extremities  Post vital signs: Reviewed and stable  Complications: No apparent anesthesia complications

## 2011-03-02 NOTE — Interval H&P Note (Signed)
History and Physical Interval Note:   03/02/2011   9:38 AM   Don Palmer  has presented today for surgery, with the diagnosis of left subdural hematoma  The various methods of treatment have been discussed with the patient and family. After consideration of risks, benefits and other options for treatment, the patient has consented to  Procedure(s): CRANIOTOMY HEMATOMA EVACUATION SUBDURAL as a surgical intervention .  The patients' history has been reviewed, patient examined, no change in status, stable for surgery.  I have reviewed the patients' chart and labs.  Questions were answered to the patient's satisfaction.     Clydene Fake  MD

## 2011-03-02 NOTE — Anesthesia Postprocedure Evaluation (Signed)
Anesthesia Post Note  Patient: Don Palmer  Procedure(s) Performed:  CRANIOTOMY HEMATOMA EVACUATION SUBDURAL  Anesthesia type: General  Patient location: PACU  Post pain: Pain level controlled and Adequate analgesia  Post assessment: Post-op Vital signs reviewed, Patient's Cardiovascular Status Stable, Respiratory Function Stable, Patent Airway and Pain level controlled  Last Vitals:  Filed Vitals:   03/02/11 1153  BP:   Pulse: 66  Temp: 36.7 C  Resp: 17    Post vital signs: Reviewed and stable  Level of consciousness: awake, alert  and oriented  Complications: No apparent anesthesia complications

## 2011-03-02 NOTE — Preoperative (Signed)
Beta Blockers   Reason not to administer Beta Blockers:Not Applicable 

## 2011-03-03 NOTE — Progress Notes (Signed)
Subjective: Slight incisional pain, no other c/o's, up to BR, eating well  Objective: Vital signs in last 24 hours: Temp:  [98 F (36.7 C)-98.4 F (36.9 C)] 98.1 F (36.7 C) (11/13 0808) Pulse Rate:  [59-84] 69  (11/13 0700) Resp:  [8-35] 9  (11/13 0700) BP: (98-128)/(53-85) 106/70 mmHg (11/13 0600) SpO2:  [98 %-100 %] 100 % (11/13 0700) Weight:  [83.915 kg (185 lb)] 185 lb (83.915 kg) (11/12 1300)  Intake/Output from previous day: 11/12 0701 - 11/13 0700 In: 3955 [P.O.:1080; I.V.:2725; IV Piggyback:150] Out: 1545 [Urine:1200; Drains:345] Intake/Output this shift:    neuro - incision- C/D/I , A,A,Ox3 - no drift , motor,sensation intact , speech fluent Drain - 35 cc last 8 hours  Lab Results:  Basename 02/28/11 1626  WBC 8.6  HGB 14.7  HCT 42.2  PLT 266   BMET  Basename 03/02/11 0751 02/28/11 1626  NA 140 136  K 4.2 3.0*  CL 104 100  CO2 26 27  GLUCOSE 133* 83  BUN 10 13  CREATININE 1.44* 1.17  CALCIUM 10.1 9.8    Studies/Results: No results found.  Assessment/Plan: Pt doing well, CPM , ? Remove dain next 1-2 days  LOS: 1 day     Boston Cookson R, MD 03/03/2011, 9:32 AM

## 2011-03-04 MED ORDER — OXYCODONE-ACETAMINOPHEN 5-325 MG PO TABS: 1.0000 | ORAL_TABLET | ORAL | Status: DC | PRN

## 2011-03-04 MED ORDER — CEFAZOLIN SODIUM 1-5 GM-% IV SOLN
1.0000 g | Freq: Three times a day (TID) | INTRAVENOUS | Status: AC
  Administered 2011-03-04: 1 g via INTRAVENOUS
  Filled 2011-03-04: qty 50

## 2011-03-04 NOTE — Progress Notes (Signed)
  Subjective: No c/o - no pain - up oob  Objective: Vital signs in last 24 hours: Temp:  [98.3 F (36.8 C)-99.2 F (37.3 C)] 98.3 F (36.8 C) (11/14 0310) Pulse Rate:  [54-83] 54  (11/14 0600) Resp:  [15-32] 21  (11/14 0500) BP: (101-137)/(46-116) 127/77 mmHg (11/14 0600) SpO2:  [99 %-100 %] 100 % (11/14 0600)  Intake/Output from previous day: 11/13 0701 - 11/14 0700 In: 3260 [P.O.:960; I.V.:2200; IV Piggyback:100] Out: 2779 [Urine:2725; Drains:54] Intake/Output this shift:    Neuro - intact - no drift - speech fluent - incision C/D/I drain  removed  Lab Results: No results found for this basename: WBC:2,HGB:2,HCT:2,PLT:2 in the last 72 hours BMET  [facility] 03/02/11 0751  NA 140  K 4.2  CL 104  CO2 26  GLUCOSE 133*  BUN 10  CREATININE 1.44*  CALCIUM 10.1    Studies/Results: No results found.  Assessment/Plan: Pt       Doing well- drain removed - aftert lunch if doing well - Transfer to floor - ? D/c in am  LOS: 2 days     Clydene Fake, MD 03/04/2011, 9:32 AM

## 2011-03-04 NOTE — Progress Notes (Signed)
Utilization review completed. Baker Kogler, RN, BSN. 03/04/11 

## 2011-03-05 MED ORDER — HYDROCODONE-ACETAMINOPHEN 5-325 MG PO TABS: 1.0000 | ORAL_TABLET | ORAL | Status: DC | PRN

## 2011-03-05 NOTE — Discharge Summary (Signed)
  Physician Discharge Summary  Patient ID: Don Palmer MRN: [id number] DOB/AGE: [birth year] 40 y.o.  Admit date: 03/02/2011 Discharge date: 03/05/2011  Admission Diagnoses:SDH  Discharge Diagnoses: SDH Active Problems:  * No active hospital problems. *    Discharged Condition: good  Hospital Course: Patient admitted the day of surgery. Underwent craniotomy evacuation of subacute subdural hematoma. Patient transferred to recovery room and then to the intensive care unit. Patient is doing well training and some expected drainage for the first day and a half or so when the drainage diminished almost nothing. A drain was then removed a second postop day patient to slowly increase activities transferred to the floor. He's been doing well no drift decreased a headache no right-sided symptoms speaks fluent and patient discharged home in stable condition. Fluffy with me next week for staple removal follow up with a CT ahead a few weeks after that  Consults: none  Significant Diagnostic Studies: none  Treatments: surgery: craniotomy, evacuation SDH  Discharge Exam: Blood pressure 109/68, pulse 56, temperature 98.3 F (36.8 C), temperature source Oral, resp. rate 16, height 5\' 7"  (1.702 m), weight 83.915 kg (185 lb), SpO2 99.00%. Wound: C/D/I  Disposition: home  Discharge Orders    Future Orders Please Complete By Expires   Change dressing        Current Discharge Medication List    START taking these medications   Details  HYDROcodone-acetaminophen (NORCO) 5-325 MG per tablet Take 1-2 tablets by mouth every 4 (four) hours as needed. Qty: 41 tablet, Refills: 0      STOP taking these medications     oxyCODONE-acetaminophen (PERCOCET) 5-325 MG per tablet          Signed: Clydene Fake, MD 03/05/2011, 6:55 AM

## 2011-03-06 ENCOUNTER — Encounter (HOSPITAL_COMMUNITY): Payer: Self-pay | Admitting: Neurosurgery

## 2011-03-11 ENCOUNTER — Other Ambulatory Visit: Payer: Self-pay | Admitting: Neurosurgery

## 2011-03-11 DIAGNOSIS — I6200 Nontraumatic subdural hemorrhage, unspecified: Secondary | ICD-10-CM

## 2011-03-16 ENCOUNTER — Emergency Department (HOSPITAL_COMMUNITY)
Admission: EM | Admit: 2011-03-16 | Discharge: 2011-03-16 | Discharge status: Against Medical Advice | Payer: 59 | Attending: Emergency Medicine | Admitting: Emergency Medicine

## 2011-03-16 ENCOUNTER — Encounter (HOSPITAL_COMMUNITY): Payer: Self-pay | Admitting: *Deleted

## 2011-03-16 DIAGNOSIS — R51 Headache: Secondary | ICD-10-CM | POA: Insufficient documentation

## 2011-03-16 HISTORY — DX: Traumatic subdural hemorrhage with loss of consciousness of unspecified duration, initial encounter: S06.5X9A

## 2011-03-16 HISTORY — DX: Traumatic subdural hemorrhage with loss of consciousness status unknown, initial encounter: S06.5XAA

## 2011-03-16 NOTE — ED Notes (Signed)
The pt had surgery on his brain 2 weeks ago and since the surgery he has had pain along the lt scalp incision intermittently since then..  He has pain along the incision today until he took some tylenol.  The staples were removed Wednesday and since then the area has been more tender

## 2011-03-17 ENCOUNTER — Ambulatory Visit
Admission: RE | Admit: 2011-03-17 | Discharge: 2011-03-17 | Disposition: A | Discharge status: Home / Self Care | Payer: 59 | Source: Ambulatory Visit | Attending: Neurosurgery | Admitting: Neurosurgery

## 2011-03-17 DIAGNOSIS — I6200 Nontraumatic subdural hemorrhage, unspecified: Secondary | ICD-10-CM

## 2011-03-18 ENCOUNTER — Encounter (HOSPITAL_COMMUNITY): Payer: Self-pay | Admitting: *Deleted

## 2011-03-18 ENCOUNTER — Emergency Department (HOSPITAL_COMMUNITY)
Admission: EM | Admit: 2011-03-18 | Discharge: 2011-03-18 | Disposition: A | Discharge status: Home / Self Care | Payer: 59 | Attending: Emergency Medicine | Admitting: Emergency Medicine

## 2011-03-18 DIAGNOSIS — R51 Headache: Secondary | ICD-10-CM | POA: Insufficient documentation

## 2011-03-18 DIAGNOSIS — R630 Anorexia: Secondary | ICD-10-CM | POA: Insufficient documentation

## 2011-03-18 MED ORDER — OXYCODONE-ACETAMINOPHEN 5-325 MG PO TABS
1.0000 | ORAL_TABLET | Freq: Once | ORAL | Status: AC
  Administered 2011-03-18: 1 via ORAL
  Filled 2011-03-18: qty 1

## 2011-03-18 MED ORDER — OXYCODONE-ACETAMINOPHEN 5-325 MG PO TABS: 1.0000 | ORAL_TABLET | Freq: Four times a day (QID) | ORAL | Status: AC | PRN

## 2011-03-18 NOTE — ED Notes (Signed)
Pt had craniotomy done on 11/12, reports having pain to left side of head, around the incision. Can not take his vicodin, no relief with tylenol at home. Was here two days ago for same, but left ama. No acute distress noted at triage.

## 2011-03-18 NOTE — ED Notes (Signed)
Pt having a headache for over 1 week,.  When pt. Spoke with his surgeon, they instructed him to take his pain medication or his tylenol.   Pt. Denies any n/v/d. Pt. Denies any photophobia, unsteady gait.

## 2011-03-18 NOTE — ED Notes (Signed)
Pt reports having MRI done yesterday

## 2011-03-18 NOTE — ED Provider Notes (Signed)
History     CSN: [id number] Arrival date & time: 03/18/2011  2:10 PM   First MD Initiated Contact with Patient 03/18/11 1536      Chief Complaint  Patient presents with  . Headache    (Consider location/radiation/quality/duration/timing/severity/associated sxs/prior treatment) HPI Patient presents with complaint of headache. Patient had a craniotomy performed November 12 to evacuate a subdural hematoma. He states that initially his pain was well controlled however over the past approximately one week he has begun to have pain around his incision and on the left side of his head. He has been taking Vicodin which did not help to relieve the pain. He called Dr. Doreen Beam office who recommended he try Tylenol only. He states this does not help to relieve the pain either. Vicodin causes him to have decreased appetite as well. He has no fevers, no changes in speech, no diffuse headache or drainage from his ear. He is no redness or swelling around the incision site. He had a CT scan of his head performed yesterday which showed improvement in the subdural and improvement in the midline shift. He's had no weakness of his extremities or changes in his speech. Nothing makes the headache worse or better. There are no other associated symptoms. The headache is described as soreness around the incision site on his scalp.  Past Medical History  Diagnosis Date  . Headache     has subdural hemotoma  . No pertinent past medical history   . Subdural hematoma     Past Surgical History  Procedure Date  . Shoulder open rotator cuff repair     2010  left shoulder  . Craniotomy 03/02/2011    Procedure: CRANIOTOMY HEMATOMA EVACUATION SUBDURAL;  Surgeon: Clydene Fake;  Location: [facility];  Service: Neurosurgery;  Laterality: Left;    History reviewed. No pertinent family history.  History  Substance Use Topics  . Smoking status: Never Smoker   . Smokeless tobacco: Not on file  . Alcohol Use: Yes        Review of Systems ROS reviewed and otherwise negative except for mentioned in HPI  Allergies  Review of patient's allergies indicates no known allergies.  Home Medications   Current Outpatient Rx  Name Route Sig Dispense Refill  . ACETAMINOPHEN 500 MG PO TABS Oral Take 1,000 mg by mouth every 6 (six) hours as needed. For pain     . OXYCODONE-ACETAMINOPHEN 5-325 MG PO TABS Oral Take 1-2 tablets by mouth every 6 (six) hours as needed for pain. 15 tablet 0    BP 139/81  Pulse 69  Temp(Src) 99.3 F (37.4 C) (Oral)  Resp 16  SpO2 98% Vitals reviewed Physical Exam Physical Examination: General appearance - alert, well appearing, and in no distress Mental status - alert, oriented to person, place, and time Eyes - pupils equal and reactive, extraocular eye movements intact Mouth - mucous membranes moist, pharynx normal without lesions Chest - clear to auscultation, no wheezes, rales or rhonchi, symmetric air entry Heart - normal rate, regular rhythm, normal S1, S2, no murmurs, rubs, clicks or gallops Neurological - alert, oriented, normal speech, no focal findings or movement disorder noted Musculoskeletal - no joint tenderness, deformity or swelling Extremities - peripheral pulses normal, no pedal edema, no clubbing or cyanosis Skin - normal coloration and turgor, well healed incision over left parietal scalp- mild ttp surrounding area- no drainage or fluctuance  ED Course  Procedures (including critical care time)  Labs Reviewed - No data  to display Ct Head Wo Contrast  03/17/2011  *RADIOLOGY REPORT*  Clinical Data: Subdural hematoma.  Craniotomy 02/28/2011  CT HEAD WITHOUT CONTRAST  Technique:  Contiguous axial images were obtained from the base of the skull through the vertex without contrast.  Comparison: 02/28/2011  Findings: Left frontal craniotomy for subdural drainage.  No drain is in place.  Mixed density subdural hematoma left frontal lobe is significantly smaller.   This measures 9 mm in the anterior portion and approximately 10 mm in the midportion.  Mild midline shift measures 5.5 mm and is significantly improved.  Negative for hydrocephalus.  Negative for acute infarct or mass.  IMPRESSION: Improvement in left frontal subdural hematoma.  There remains a mixed density subdural hematoma measuring approximately 10 mm.  5.5 mm midline shift has improved significantly from the prior study.  Original Report Authenticated By: Camelia Phenes, M.D.     1. Headache       MDM  Patient presenting approximately 2 weeks status post craniotomy for drainage of a subdural hematoma. He is having pain around his incision site. There is no sign of infection. His neurologic exam is normal. He states the Vicodin is not helping to relieve his pain. Patient given Percocet for discomfort and is strongly encouraged to followup with Dr. Phoebe Perch. He did have a normal head CT performed yesterday so I do not believe there is any indication for imaging at this time. He was discharged with strict return precautions and is agreeable with this plan.        Ethelda Chick, MD 03/18/11 [id number]

## 2011-03-31 ENCOUNTER — Inpatient Hospital Stay (HOSPITAL_BASED_OUTPATIENT_CLINIC_OR_DEPARTMENT_OTHER)
Admission: AD | Admit: 2011-03-31 | Discharge: 2011-04-07 | DRG: 024 | Disposition: A | Discharge status: Home / Self Care | Payer: 59 | Source: Ambulatory Visit | Attending: Neurosurgery | Admitting: Neurosurgery

## 2011-03-31 ENCOUNTER — Emergency Department (INDEPENDENT_AMBULATORY_CARE_PROVIDER_SITE_OTHER): Payer: 59

## 2011-03-31 ENCOUNTER — Encounter (HOSPITAL_BASED_OUTPATIENT_CLINIC_OR_DEPARTMENT_OTHER): Payer: Self-pay | Admitting: *Deleted

## 2011-03-31 ENCOUNTER — Encounter (HOSPITAL_COMMUNITY)
Admission: AD | Disposition: A | Discharge status: Home / Self Care | Payer: Self-pay | Source: Ambulatory Visit | Attending: Neurosurgery

## 2011-03-31 ENCOUNTER — Emergency Department (HOSPITAL_COMMUNITY): Payer: 59 | Admitting: Anesthesiology

## 2011-03-31 ENCOUNTER — Encounter (HOSPITAL_COMMUNITY): Payer: Self-pay | Admitting: Anesthesiology

## 2011-03-31 DIAGNOSIS — R4182 Altered mental status, unspecified: Secondary | ICD-10-CM

## 2011-03-31 DIAGNOSIS — G060 Intracranial abscess and granuloma: Principal | ICD-10-CM | POA: Diagnosis present

## 2011-03-31 DIAGNOSIS — B9689 Other specified bacterial agents as the cause of diseases classified elsewhere: Secondary | ICD-10-CM | POA: Diagnosis present

## 2011-03-31 DIAGNOSIS — R51 Headache: Secondary | ICD-10-CM

## 2011-03-31 HISTORY — PX: CRANIOTOMY: SHX93

## 2011-03-31 LAB — GLUCOSE, CAPILLARY: Glucose-Capillary: 84 mg/dL (ref 70–99)

## 2011-03-31 LAB — CBC
HCT: 37.7 % — ABNORMAL LOW (ref 39.0–52.0)
Hemoglobin: 12.9 g/dL — ABNORMAL LOW (ref 13.0–17.0)
MCH: 27.3 pg (ref 26.0–34.0)
MCV: 79.9 fL (ref 78.0–100.0)
Platelets: 477 10*3/uL — ABNORMAL HIGH (ref 150–400)
RBC: 4.72 MIL/uL (ref 4.22–5.81)
WBC: 10.4 10*3/uL (ref 4.0–10.5)

## 2011-03-31 LAB — DIFFERENTIAL
Eosinophils Absolute: 0 10*3/uL (ref 0.0–0.7)
Eosinophils Relative: 0 % (ref 0–5)
Lymphocytes Relative: 18 % (ref 12–46)
Lymphs Abs: 1.8 10*3/uL (ref 0.7–4.0)
Monocytes Relative: 7 % (ref 3–12)

## 2011-03-31 LAB — BASIC METABOLIC PANEL
Chloride: 102 mEq/L (ref 96–112)
GFR calc Af Amer: >90 mL/min (ref >90)
GFR calc non Af Amer: >90 mL/min (ref >90)
Glucose, Bld: 115 mg/dL — ABNORMAL HIGH (ref 70–99)
Potassium: 3.8 mEq/L (ref 3.5–5.1)
Sodium: 139 mEq/L (ref 135–145)

## 2011-03-31 LAB — AFB CULTURE WITH SMEAR (NOT AT ARMC)

## 2011-03-31 LAB — PROTIME-INR: Prothrombin Time: 13.9 seconds (ref 11.6–15.2)

## 2011-03-31 SURGERY — CRANIOTOMY HEMATOMA EVACUATION SUBDURAL
Anesthesia: General | Site: Head | Laterality: Left | Wound class: Dirty or Infected

## 2011-03-31 MED ORDER — VANCOMYCIN HCL IN DEXTROSE 1-5 GM/200ML-% IV SOLN
INTRAVENOUS | Status: AC
  Administered 2011-03-31: 1000 mg via INTRAVENOUS
  Filled 2011-03-31: qty 200

## 2011-03-31 MED ORDER — PROMETHAZINE HCL 25 MG/ML IJ SOLN
12.5000 mg | INTRAMUSCULAR | Status: DC | PRN
Start: 2011-03-31 — End: 2011-04-07
  Filled 2011-03-31: qty 1

## 2011-03-31 MED ORDER — PROMETHAZINE HCL 25 MG/ML IJ SOLN: 6.2500 mg | INTRAMUSCULAR | Status: DC | PRN

## 2011-03-31 MED ORDER — ACETAMINOPHEN 650 MG RE SUPP: 650.0000 mg | RECTAL | Status: DC | PRN

## 2011-03-31 MED ORDER — POTASSIUM CHLORIDE IN NACL 20-0.9 MEQ/L-% IV SOLN
INTRAVENOUS | Status: DC
  Administered 2011-03-31 – 2011-04-02 (×3): via INTRAVENOUS
  Filled 2011-03-31 (×14): qty 1000

## 2011-03-31 MED ORDER — HYDROMORPHONE HCL PF 1 MG/ML IJ SOLN: 0.2500 mg | INTRAMUSCULAR | Status: DC | PRN

## 2011-03-31 MED ORDER — BISACODYL 10 MG RE SUPP: 10.0000 mg | Freq: Every day | RECTAL | Status: DC | PRN

## 2011-03-31 MED ORDER — SODIUM CHLORIDE 0.9 % IR SOLN
Status: DC | PRN
  Administered 2011-03-31 (×2): 1000 mL

## 2011-03-31 MED ORDER — DEXTROSE 5 % IV SOLN
1.0000 g | Freq: Once | INTRAVENOUS | Status: DC
  Filled 2011-03-31 (×2): qty 10

## 2011-03-31 MED ORDER — LORAZEPAM 2 MG/ML IJ SOLN: 1.0000 mg | Freq: Once | INTRAMUSCULAR | Status: DC | PRN

## 2011-03-31 MED ORDER — FENTANYL CITRATE 0.05 MG/ML IJ SOLN
INTRAMUSCULAR | Status: DC | PRN
  Administered 2011-03-31: 50 ug via INTRAVENOUS
  Administered 2011-03-31 (×3): 100 ug via INTRAVENOUS

## 2011-03-31 MED ORDER — SODIUM CHLORIDE 0.9 % IV SOLN
500.0000 mg | Freq: Two times a day (BID) | INTRAVENOUS | Status: DC
  Administered 2011-03-31 – 2011-04-02 (×4): 500 mg via INTRAVENOUS
  Filled 2011-03-31 (×5): qty 5

## 2011-03-31 MED ORDER — CEFAZOLIN SODIUM 1-5 GM-% IV SOLN
INTRAVENOUS | Status: AC
  Filled 2011-03-31: qty 50

## 2011-03-31 MED ORDER — MEPERIDINE HCL 25 MG/ML IJ SOLN: 6.2500 mg | INTRAMUSCULAR | Status: DC | PRN

## 2011-03-31 MED ORDER — LIDOCAINE-EPINEPHRINE 1 %-1:100000 IJ SOLN
INTRAMUSCULAR | Status: DC | PRN
  Administered 2011-03-31: 19 mL

## 2011-03-31 MED ORDER — HEMOSTATIC AGENTS (NO CHARGE) OPTIME
TOPICAL | Status: DC | PRN
  Administered 2011-03-31: 1 via TOPICAL

## 2011-03-31 MED ORDER — CEFAZOLIN SODIUM 1-5 GM-% IV SOLN
INTRAVENOUS | Status: DC | PRN
  Administered 2011-03-31: 1 g via INTRAVENOUS

## 2011-03-31 MED ORDER — ONDANSETRON HCL 4 MG/2ML IJ SOLN: 4.0000 mg | INTRAMUSCULAR | Status: DC | PRN

## 2011-03-31 MED ORDER — MICROFIBRILLAR COLL HEMOSTAT EX PADS
MEDICATED_PAD | CUTANEOUS | Status: DC | PRN
  Administered 2011-03-31: 1 via TOPICAL

## 2011-03-31 MED ORDER — MAGNESIUM HYDROXIDE 400 MG/5ML PO SUSP: 30.0000 mL | Freq: Every day | ORAL | Status: DC | PRN

## 2011-03-31 MED ORDER — LABETALOL HCL 5 MG/ML IV SOLN
5.0000 mg | INTRAVENOUS | Status: DC | PRN
  Filled 2011-03-31: qty 4

## 2011-03-31 MED ORDER — FENTANYL CITRATE 0.05 MG/ML IJ SOLN: 50.0000 ug | INTRAMUSCULAR | Status: DC | PRN

## 2011-03-31 MED ORDER — DOCUSATE SODIUM 100 MG PO CAPS
100.0000 mg | ORAL_CAPSULE | Freq: Two times a day (BID) | ORAL | Status: DC
  Administered 2011-04-01 – 2011-04-06 (×3): 100 mg via ORAL
  Filled 2011-03-31 (×11): qty 1

## 2011-03-31 MED ORDER — NEOSTIGMINE METHYLSULFATE 1 MG/ML IJ SOLN
INTRAMUSCULAR | Status: DC | PRN
  Administered 2011-03-31: 4 mg via INTRAVENOUS

## 2011-03-31 MED ORDER — GLYCOPYRROLATE 0.2 MG/ML IJ SOLN
INTRAMUSCULAR | Status: DC | PRN
  Administered 2011-03-31: .4 mg via INTRAVENOUS

## 2011-03-31 MED ORDER — SUCCINYLCHOLINE CHLORIDE 20 MG/ML IJ SOLN
INTRAMUSCULAR | Status: DC | PRN
  Administered 2011-03-31: 100 mg via INTRAVENOUS

## 2011-03-31 MED ORDER — SODIUM CHLORIDE 0.9 % IR SOLN
Status: DC | PRN
  Administered 2011-03-31 (×2)

## 2011-03-31 MED ORDER — SODIUM CHLORIDE 0.9 % IV SOLN
INTRAVENOUS | Status: AC
  Filled 2011-03-31: qty 500

## 2011-03-31 MED ORDER — MIDAZOLAM HCL 2 MG/2ML IJ SOLN: 1.0000 mg | INTRAMUSCULAR | Status: DC | PRN

## 2011-03-31 MED ORDER — LORAZEPAM 2 MG/ML IJ SOLN
INTRAMUSCULAR | Status: AC
  Filled 2011-03-31: qty 1

## 2011-03-31 MED ORDER — ROCURONIUM BROMIDE 100 MG/10ML IV SOLN
INTRAVENOUS | Status: DC | PRN
  Administered 2011-03-31: 50 mg via INTRAVENOUS

## 2011-03-31 MED ORDER — CEFTRIAXONE SODIUM 1 G IJ SOLR
1.0000 g | Freq: Two times a day (BID) | INTRAMUSCULAR | Status: DC
  Administered 2011-04-01: 1 g via INTRAVENOUS
  Filled 2011-03-31 (×2): qty 10

## 2011-03-31 MED ORDER — DEXAMETHASONE SODIUM PHOSPHATE 4 MG/ML IJ SOLN
4.0000 mg | Freq: Four times a day (QID) | INTRAMUSCULAR | Status: DC
  Administered 2011-03-31 – 2011-04-02 (×6): 4 mg via INTRAVENOUS
  Filled 2011-03-31 (×10): qty 1

## 2011-03-31 MED ORDER — HYDROCODONE-ACETAMINOPHEN 5-325 MG PO TABS
1.0000 | ORAL_TABLET | ORAL | Status: DC | PRN
  Administered 2011-04-02 – 2011-04-05 (×6): 1 via ORAL
  Administered 2011-04-06 (×2): 2 via ORAL
  Filled 2011-03-31 (×2): qty 1
  Filled 2011-03-31 (×2): qty 2
  Filled 2011-03-31 (×2): qty 1
  Filled 2011-03-31: qty 2
  Filled 2011-03-31 (×2): qty 1

## 2011-03-31 MED ORDER — VANCOMYCIN HCL IN DEXTROSE 1-5 GM/200ML-% IV SOLN
1000.0000 mg | Freq: Three times a day (TID) | INTRAVENOUS | Status: DC
  Administered 2011-04-01 – 2011-04-02 (×4): 1000 mg via INTRAVENOUS
  Filled 2011-03-31 (×6): qty 200

## 2011-03-31 MED ORDER — SODIUM CHLORIDE 0.9 % IV SOLN
INTRAVENOUS | Status: DC | PRN
  Administered 2011-03-31: 17:00:00 via INTRAVENOUS

## 2011-03-31 MED ORDER — BACITRACIN ZINC 500 UNIT/GM EX OINT
TOPICAL_OINTMENT | CUTANEOUS | Status: DC | PRN
  Administered 2011-03-31: 1 via TOPICAL

## 2011-03-31 MED ORDER — PROPOFOL 10 MG/ML IV EMUL
INTRAVENOUS | Status: DC | PRN
  Administered 2011-03-31 (×2): 100 mg via INTRAVENOUS

## 2011-03-31 MED ORDER — THROMBIN 20000 UNITS EX KIT
PACK | CUTANEOUS | Status: DC | PRN
  Administered 2011-03-31: 20000 [IU] via TOPICAL

## 2011-03-31 MED ORDER — BACITRACIN 50000 UNITS IM SOLR
INTRAMUSCULAR | Status: AC
  Filled 2011-03-31: qty 50000

## 2011-03-31 MED ORDER — SODIUM CHLORIDE 0.9 % IV SOLN
500.0000 mg | INTRAVENOUS | Status: AC
  Administered 2011-03-31: 500 mg via INTRAVENOUS
  Filled 2011-03-31: qty 5

## 2011-03-31 MED ORDER — HYDROMORPHONE HCL PF 1 MG/ML IJ SOLN
0.2500 mg | INTRAMUSCULAR | Status: DC | PRN
Start: 2011-03-31 — End: 2011-03-31

## 2011-03-31 MED ORDER — ACETAMINOPHEN 325 MG PO TABS: 650.0000 mg | ORAL_TABLET | ORAL | Status: DC | PRN

## 2011-03-31 MED ORDER — VANCOMYCIN HCL IN DEXTROSE 1-5 GM/200ML-% IV SOLN
1000.0000 mg | Freq: Once | INTRAVENOUS | Status: DC
  Filled 2011-03-31: qty 200

## 2011-03-31 MED ORDER — DEXTROSE 5 % IV SOLN
1.0000 g | Freq: Once | INTRAVENOUS | Status: DC
  Administered 2011-03-31: 1 g via INTRAVENOUS

## 2011-03-31 MED ORDER — PROMETHAZINE HCL 12.5 MG PO TABS
12.5000 mg | ORAL_TABLET | ORAL | Status: DC | PRN
  Filled 2011-03-31: qty 2

## 2011-03-31 MED ORDER — INSULIN ASPART 100 UNIT/ML ~~LOC~~ SOLN
0.0000 [IU] | SUBCUTANEOUS | Status: DC
  Administered 2011-04-01: 1 [IU] via SUBCUTANEOUS
  Administered 2011-04-02 (×2): 2 [IU] via SUBCUTANEOUS
  Administered 2011-04-03: 1 [IU] via SUBCUTANEOUS
  Administered 2011-04-03: 2 [IU] via SUBCUTANEOUS
  Administered 2011-04-04: 3 [IU] via SUBCUTANEOUS
  Administered 2011-04-04: 1 [IU] via SUBCUTANEOUS
  Administered 2011-04-04: 2 [IU] via SUBCUTANEOUS
  Administered 2011-04-04: 1 [IU] via SUBCUTANEOUS
  Filled 2011-03-31 (×2): qty 3

## 2011-03-31 MED ORDER — MORPHINE SULFATE 4 MG/ML IJ SOLN
1.0000 mg | INTRAMUSCULAR | Status: DC | PRN
  Administered 2011-04-01: 2 mg via INTRAVENOUS
  Filled 2011-03-31: qty 1

## 2011-03-31 MED ORDER — ONDANSETRON HCL 4 MG/2ML IJ SOLN
INTRAMUSCULAR | Status: DC | PRN
  Administered 2011-03-31: 4 mg via INTRAVENOUS

## 2011-03-31 SURGICAL SUPPLY — 69 items
BAG DECANTER FOR FLEXI CONT (MISCELLANEOUS) ×2 IMPLANT
BANDAGE GAUZE 4  KLING STR (GAUZE/BANDAGES/DRESSINGS) IMPLANT
BANDAGE GAUZE ELAST BULKY 4 IN (GAUZE/BANDAGES/DRESSINGS) IMPLANT
BENZOIN TINCTURE PRP APPL 2/3 (GAUZE/BANDAGES/DRESSINGS) ×2 IMPLANT
BIT DRILL WIRE PASS 1.3MM (BIT) IMPLANT
BLADE CLIPPER SURG NEURO (BLADE) IMPLANT
BRUSH SCRUB EZ PLAIN DRY (MISCELLANEOUS) ×2 IMPLANT
BUR ACORN 6.0 PRECISION (BURR) ×2 IMPLANT
BUR ROUTER D-58 CRANI (BURR) ×2 IMPLANT
CANISTER SUCTION 2500CC (MISCELLANEOUS) ×2 IMPLANT
CLIP TI MEDIUM 6 (CLIP) ×2 IMPLANT
CLOTH BEACON ORANGE TIMEOUT ST (SAFETY) ×2 IMPLANT
CONT SPEC 4OZ CLIKSEAL STRL BL (MISCELLANEOUS) ×2 IMPLANT
CORDS BIPOLAR (ELECTRODE) ×2 IMPLANT
DRAIN JACKSON PRATT 10MM FLAT (MISCELLANEOUS) IMPLANT
DRAPE NEUROLOGICAL W/INCISE (DRAPES) ×2 IMPLANT
DRAPE SURG 17X23 STRL (DRAPES) IMPLANT
DRAPE WARM FLUID 44X44 (DRAPE) ×2 IMPLANT
DRESSING TELFA 8X3 (GAUZE/BANDAGES/DRESSINGS) ×2 IMPLANT
DRILL WIRE PASS 1.3MM (BIT)
DRSG OPSITE 4X5.5 SM (GAUZE/BANDAGES/DRESSINGS) ×2 IMPLANT
DURAMATRIX ONLAY 3X3 (Plate) ×2 IMPLANT
ELECT CAUTERY BLADE 6.4 (BLADE) ×2 IMPLANT
ELECT NEEDLE TIP 2.8 STRL (NEEDLE) ×2 IMPLANT
ELECT REM PT RETURN 9FT ADLT (ELECTROSURGICAL) ×2
ELECTRODE REM PT RTRN 9FT ADLT (ELECTROSURGICAL) ×1 IMPLANT
EVACUATOR SILICONE 100CC (DRAIN) IMPLANT
GAUZE SPONGE 4X4 16PLY XRAY LF (GAUZE/BANDAGES/DRESSINGS) IMPLANT
GLOVE BIO SURGEON STRL SZ 6.5 (GLOVE) ×4 IMPLANT
GLOVE BIOGEL PI IND STRL 6.5 (GLOVE) ×1 IMPLANT
GLOVE BIOGEL PI INDICATOR 6.5 (GLOVE) ×1
GLOVE ECLIPSE 7.5 STRL STRAW (GLOVE) ×2 IMPLANT
GLOVE EXAM NITRILE LRG STRL (GLOVE) IMPLANT
GLOVE EXAM NITRILE MD LF STRL (GLOVE) ×2 IMPLANT
GLOVE EXAM NITRILE XL STR (GLOVE) IMPLANT
GLOVE EXAM NITRILE XS STR PU (GLOVE) IMPLANT
GOWN BRE IMP SLV AUR LG STRL (GOWN DISPOSABLE) ×4 IMPLANT
GOWN BRE IMP SLV AUR XL STRL (GOWN DISPOSABLE) IMPLANT
GOWN STRL REIN 2XL LVL4 (GOWN DISPOSABLE) IMPLANT
HOOK DURA (MISCELLANEOUS) ×2 IMPLANT
KIT BASIN OR (CUSTOM PROCEDURE TRAY) ×2 IMPLANT
KIT ROOM TURNOVER OR (KITS) ×2 IMPLANT
NEEDLE HYPO 22GX1.5 SAFETY (NEEDLE) ×2 IMPLANT
NS IRRIG 1000ML POUR BTL (IV SOLUTION) ×4 IMPLANT
PACK CRANIOTOMY (CUSTOM PROCEDURE TRAY) ×2 IMPLANT
PAD ARMBOARD 7.5X6 YLW CONV (MISCELLANEOUS) ×6 IMPLANT
PATTIES SURGICAL .25X.25 (GAUZE/BANDAGES/DRESSINGS) IMPLANT
PATTIES SURGICAL .5 X.5 (GAUZE/BANDAGES/DRESSINGS) IMPLANT
PATTIES SURGICAL .5 X3 (DISPOSABLE) IMPLANT
PATTIES SURGICAL .75X.75 (GAUZE/BANDAGES/DRESSINGS) ×2 IMPLANT
PATTIES SURGICAL 1X1 (DISPOSABLE) IMPLANT
PIN MAYFIELD SKULL DISP (PIN) IMPLANT
SPECIMEN JAR SMALL (MISCELLANEOUS) IMPLANT
SPONGE GAUZE 4X4 12PLY (GAUZE/BANDAGES/DRESSINGS) IMPLANT
SPONGE NEURO XRAY DETECT 1X3 (DISPOSABLE) IMPLANT
STAPLER SKIN PROX WIDE 3.9 (STAPLE) ×2 IMPLANT
SUT ETHILON 3 0 FSL (SUTURE) IMPLANT
SUT NURALON 4 0 TR CR/8 (SUTURE) ×2 IMPLANT
SUT VIC AB 2-0 CP2 18 (SUTURE) ×4 IMPLANT
SWAB CULTURE LIQ STUART DBL (MISCELLANEOUS) ×2 IMPLANT
SYR 20ML ECCENTRIC (SYRINGE) ×2 IMPLANT
SYR CONTROL 10ML LL (SYRINGE) ×2 IMPLANT
TAPE PAPER MEDFIX 1IN X 10YD (GAUZE/BANDAGES/DRESSINGS) IMPLANT
TOWEL OR 17X24 6PK STRL BLUE (TOWEL DISPOSABLE) IMPLANT
TOWEL OR 17X26 10 PK STRL BLUE (TOWEL DISPOSABLE) ×2 IMPLANT
TRAY FOLEY CATH 14FRSI W/METER (CATHETERS) ×2 IMPLANT
TUBE ANAEROBIC SPECIMEN COL (MISCELLANEOUS) ×2 IMPLANT
UNDERPAD 30X30 INCONTINENT (UNDERPADS AND DIAPERS) IMPLANT
WATER STERILE IRR 1000ML POUR (IV SOLUTION) ×2 IMPLANT

## 2011-03-31 NOTE — ED Notes (Addendum)
Pt came into ed by [facility].  Unable to fully access neuro status due to pt decreased neuro abilities.  Pt not communicating and very aggitated.

## 2011-03-31 NOTE — ED Notes (Signed)
Spoke with Dr Phoebe Perch told to Admit to 3100

## 2011-03-31 NOTE — Anesthesia Procedure Notes (Addendum)
Procedure Name: Intubation Date/Time: 03/31/2011 5:38 PM Performed by: Glendora Score Pre-anesthesia Checklist: Patient identified, Emergency Drugs available, Suction available and Patient being monitored Patient Re-evaluated:Patient Re-evaluated prior to inductionOxygen Delivery Method: Circle System Utilized Preoxygenation: Pre-oxygenation with 100% oxygen Intubation Type: IV induction, Rapid sequence and Circoid Pressure applied Laryngoscope Size: Miller and 2 Grade View: Grade I Tube type: Oral Tube size: 7.5 mm Number of attempts: 1 Airway Equipment and Method: stylet Placement Confirmation: ETT inserted through vocal cords under direct vision,  positive ETCO2 and breath sounds checked- equal and bilateral Secured at: 23 cm Tube secured with: Tape Dental Injury: Teeth and Oropharynx as per pre-operative assessment

## 2011-03-31 NOTE — Anesthesia Postprocedure Evaluation (Signed)
  Anesthesia Post-op Note  Patient: Don Palmer  Procedure(s) Performed:  CRANIOTOMY HEMATOMA EVACUATION SUBDURAL - Left Craniectomy for Subdural Abcess  Patient Location: PACU  Anesthesia Type: General  Level of Consciousness: sedated  Airway and Oxygen Therapy: Patient Spontanous Breathing  Post-op Pain: none  Post-op Assessment: Post-op Vital signs reviewed, Patient's Cardiovascular Status Stable, Respiratory Function Stable, Patent Airway and No signs of Nausea or vomiting  Post-op Vital Signs: stable  Complications: No apparent anesthesia complications

## 2011-03-31 NOTE — Anesthesia Preprocedure Evaluation (Addendum)
Anesthesia Evaluation  Patient identified by MRN, date of birth, ID band Patient confused    Reviewed: Allergy & Precautions, H&P , NPO status , Unable to perform ROS - Chart review only  History of Anesthesia Complications Negative for: history of anesthetic complications  Airway Mallampati: II  Neck ROM: Full    Dental  (+) Teeth Intact   Pulmonary  clear to auscultation        Cardiovascular neg cardio ROS Regular Normal    Neuro/Psych  Headaches, Acute reaccumulation subdural hematoma    GI/Hepatic negative GI ROS, Neg liver ROS,   Endo/Other    Renal/GU      Musculoskeletal   Abdominal   Peds  Hematology   Anesthesia Other Findings   Reproductive/Obstetrics                          Anesthesia Physical Anesthesia Plan  ASA: III  Anesthesia Plan: General   Post-op Pain Management:    Induction: Intravenous  Airway Management Planned: Oral ETT  Additional Equipment:   Intra-op Plan:   Post-operative Plan: Extubation in OR  Informed Consent: I have reviewed the patients History and Physical, chart, labs and discussed the procedure including the risks, benefits and alternatives for the proposed anesthesia with the patient or authorized representative who has indicated his/her understanding and acceptance.   Dental advisory given  Plan Discussed with: CRNA and Surgeon  Anesthesia Plan Comments:         Anesthesia Quick Evaluation

## 2011-03-31 NOTE — ED Notes (Signed)
Upon exam of pt he is restless, disassociation of words. Unable to know what his baseline is.

## 2011-03-31 NOTE — ED Notes (Signed)
Headache x 2 days

## 2011-03-31 NOTE — ED Notes (Signed)
patients girlfriend and mother just got here. They state pt had a severe headache and was seen by his MD for headache on Monday and was told to take Advil. This am he woke c.o increased pain and was confused. Did not make sense with speech per girlfriend.

## 2011-03-31 NOTE — Transfer of Care (Signed)
Immediate Anesthesia Transfer of Care Note  Patient: Don Palmer  Procedure(s) Performed:  CRANIOTOMY HEMATOMA EVACUATION SUBDURAL - Left Craniectomy for Subdural Abcess  Patient Location: PACU  Anesthesia Type: General  Level of Consciousness: awake, sedated and patient cooperative  Airway & Oxygen Therapy: Patient Spontanous Breathing and Patient connected to nasal cannula oxygen  Post-op Assessment: Report given to PACU RN, Post -op Vital signs reviewed and stable and Patient moving all extremities X 4  Post vital signs: Reviewed and stable  Complications: No apparent anesthesia complications

## 2011-03-31 NOTE — Preoperative (Signed)
Beta Blockers   Reason not to administer Beta Blockers:Not Applicable 

## 2011-03-31 NOTE — Progress Notes (Signed)
ANTIBIOTIC CONSULT NOTE - INITIAL  Pharmacy Consult for Vancomycin Indication: Subdural abscess  No Known Allergies  Patient Measurements: Height: 5\' 9"  (175.3 cm) Weight: 186 lb 1.1 oz (84.4 kg) IBW/kg (Calculated) : 70.7    Vital Signs: Temp: 98.8 F (37.1 C) (12/11 2042) Temp src: Core (Comment) (12/11 2042) BP: 129/75 mmHg (12/11 2042) Pulse Rate: 78  (12/11 2042) Intake/Output from previous day:   Intake/Output from this shift: Total I/O In: 800 [I.V.:800] Out: 350 [Urine:350]  Labs:  [facility] 03/31/11 1456 03/31/11 1410  WBC -- 10.4  HGB -- 12.9*  PLT -- 477*  LABCREA -- --  CREATININE 1.00 --   Estimated Creatinine Clearance: 97.2 ml/min (by C-G formula based on Cr of 1).   Microbiology: Recent Results (from the past 720 hour(s))  SURGICAL PCR SCREEN     Status: Normal   Collection Time   03/02/11  7:52 AM      Component Value Range Status Comment   MRSA, PCR NEGATIVE  NEGATIVE  Final    Staphylococcus aureus NEGATIVE  NEGATIVE  Final     Medical History: Past Medical History  Diagnosis Date  . Headache     has subdural hemotoma  . No pertinent past medical history   . Subdural hematoma     Medications:  Prescriptions prior to admission  Medication Sig Dispense Refill  . acetaminophen (TYLENOL) 500 MG tablet Take 1,000 mg by mouth every 6 (six) hours as needed. For pain        Assessment: 40 year old man found to have subdural hematoma abscess in surgery today to start on Vancomycin post op.  Goal of Therapy:  Vancomycin trough level 15-20 mcg/ml  Plan:  Vancomycin 1g IV q8h.  Follow cultures and renal function.    Mickeal Skinner 03/31/2011,9:17 PM

## 2011-03-31 NOTE — Op Note (Signed)
03/31/2011  7:07 PM  PATIENT:  [name]  40 y.o. male  PRE-OPERATIVE DIAGNOSIS:  Subdural Hematoma  POST-OPERATIVE DIAGNOSIS:  Subdural Hematoma Abcess  PROCEDURE:  Procedure(s): CRANIecTOMY  EVACUATION SUBDURAL abscess  SURGEON:  Surgeon(s): Clydene Fake  ASSISTANTS:stern  ANESTHESIA:   general  EBL:   minimal  BLOOD ADMINISTERED:none  DRAINS: none   SPECIMEN:  Source of Specimen:  subdural purulant fluid sent for cx's  DICTATION: *Patient had prior left craniotomy for evacuation of subdural hematoma about 4 weeks ago. A followup CT 12 days ago should read collection of subdural blood but pretty good. Today patient to increase to headache and and abnormal behavior and aphasia brought to the emergency room and he had debris collection of the subdural fluid. No anterior and posterior to the craniotomy site. Significant to compression of the brain and brain edema left frontal lobe patient again for craniotomy and evacuation of the subdural fluid  Patient brought in the operating room general anesthesia induced. Patient prepped draped sterile fashion site of incision in the left side of the skull was opened over the fifth with a skin blade. Incision taken down to the skull with needle-tip Bovie and hemostasis was obtained with this. Cerebellar retractors were used and we exposed the prior  craniotomy site. We removed the screws was going to remove the skull flap. We reopened the dura at the site of the prior dural opening and purulent material started to come into the field and the and from the anterior aspect of the craniotomy. This fluid was cultured. We irrigated with about solution and ankle. Into this space and evacuating the subdural fluid in this area. We explored the posterior aspect where another pocket of the subdural fluid was seen on the CT we found this again evacuating the purulent material from this area. The brain was quite swollen . Because of the infection was  decided to leave the skull flap out.  DuraGen was placed over the brain was edematous brain we did not want to pull the direct tight to close the dura 3 (open the galea was then closed with 2-0 Vicryl interrupted sutures skin was closed staples dressing was placed. Patient was woken from anesthesia nd transferred to recovery to   PLAN OF CARE: Admit to inpatient   PATIENT DISPOSITION:  ICU - extubated and stable.

## 2011-03-31 NOTE — H&P (Signed)
Subjective: Confusion, HA  There are no active problems to display for this patient.  Past Medical History  Diagnosis Date  . Headache     has subdural hemotoma  . No pertinent past medical history   . Subdural hematoma     Past Surgical History  Procedure Date  . Shoulder open rotator cuff repair     2010  left shoulder  . Craniotomy 03/02/2011    Procedure: CRANIOTOMY HEMATOMA EVACUATION SUBDURAL;  Surgeon: Clydene Fake;  Location: [facility];  Service: Neurosurgery;  Laterality: Left;     (Not in a hospital admission) No Known Allergies  History  Substance Use Topics  . Smoking status: Never Smoker   . Smokeless tobacco: Not on file  . Alcohol Use: Yes    No family history on file.  Review of Systems Review of systems not obtained due to patient factors.  Objective: Vital signs in last 24 hours: Temp:  [97.6 F (36.4 C)-97.8 F (36.6 C)] 97.6 F (36.4 C) (12/11 1516) Pulse Rate:  [56-62] 62  (12/11 1626) Resp:  [16-18] 18  (12/11 1626) BP: (105-126)/(65-71) 107/71 mmHg (12/11 1626) SpO2:  [99 %-100 %] 100 % (12/11 1626)  pt A,A, Ox2,   but aphasic with repetition of worrds.  CN II-XII intact - motot 5/5 , sensory seems intact , no drift - can only follow 1 step commands  Data Review    Selected Labs        12/11 1410   12/11 1456        WBC 10.4          RBC 4.72          HGB 12.9           HCT 37.7           Platelets 477           Sodium     139      Potassium     3.8      Chloride     102      CO2     26      BUN     10      Creat     1.00      Calcium     9.3      INR     1.05             Imaging    (Last 72 hours) Report      12/11 1442 CT Head Wo Contrast Images  Ct Head Wo Contrast  03/31/2011 *RADIOLOGY REPORT* Clinical Data: Headache for 2 days. Mental status changes. History of craniotomy for evacuation of a subdural hematoma. CT HEAD WITHOUT CONTRAST Technique: Contiguous axial images were obtained from the base of the skull  through the vertex without contrast. Comparison: Head CT 03/17/2011 and 02/28/2011. Findings: There is a bulging extra-axial/subdural fluid collection on the left where the patient and the prior subdural hematoma. I do not see any obvious hemorrhage. There is mass effect on the adjacent white matter which appears edematous. Findings suspicious for epidural abscess with adjacent cerebritis. There is mass effect on the left lateral ventricle and midline shift of 6 mm. There is a second extra-axial fluid collection just posterior to the upper aspect of the craniotomy site which is likely a second area of epidural abscess with adjacent edema in the white matter. No findings for hemispheric infarction and/or intracranial hemorrhage. The brainstem  and cerebellum appear normal. The CSF spaces around brainstem are maintained. The bony structures are intact. The craniotomy defect is noted. IMPRESSION: 1. Extra-axial fluid collections on the left suspicious for epidural abscesses with adjacent white matter edema suggesting cerebritis. 2. 6 mm of midline shift. 3. A head CT with contrast may be helpful for further evaluation. Critical Value/emergent results were called by telephone at the time of interpretation on 03/31/2011 at 1500 hours. to Dr. Ethelda Chick, who verbally acknowledged these results. Original Report Authenticated By: P. Loralie Champagne, M.D.              Assessment/Plan: Pt with recurrent SDH, with sig mass effect and brain edema - Admit - to OR for evacuation SDH   Hallee Mckenny R, MD 03/31/2011 5:02 PM

## 2011-03-31 NOTE — ED Notes (Signed)
Transferred by care link to [facility] ED to be evaluated by Neurosurgery. Paged Neurosurgery per EDP.

## 2011-03-31 NOTE — ED Notes (Signed)
Mother, girlfriend, and Neurosurgery at bedside.

## 2011-03-31 NOTE — ED Provider Notes (Addendum)
History     CSN: [id number] Arrival date & time: 03/31/2011  1:44 PM   First MD Initiated Contact with Patient 03/31/11 1355      Chief Complaint  Patient presents with  . Headache    (Consider location/radiation/quality/duration/timing/severity/associated sxs/prior treatment) Patient is a 40 y.o. male presenting with headaches.  Headache    Level V caveat altered mental status. Patient's girlfriend and mother report that he has complained of headache for approximately the past week.. Today patient exhibiting extreme confusion and bizarre behavior. No treatment prior to coming here. Patient has been treating himself with Tylenol and hydrocodone for headaches. Patient's girlfriend reports that he was seen normal yesterday evening.. he was asleep in bed when she left for work at 5 AM today Past Medical History  Diagnosis Date  . Headache     has subdural hemotoma  . No pertinent past medical history   . Subdural hematoma     Past Surgical History  Procedure Date  . Shoulder open rotator cuff repair     2010  left shoulder  . Craniotomy 03/02/2011    Procedure: CRANIOTOMY HEMATOMA EVACUATION SUBDURAL;  Surgeon: Clydene Fake;  Location: [facility];  Service: Neurosurgery;  Laterality: Left;    No family history on file.  History  Substance Use Topics  . Smoking status: Never Smoker   . Smokeless tobacco: Not on file  . Alcohol Use: Yes      Review of Systems  Unable to perform ROS: Mental status change  Neurological: Positive for headaches.    Allergies  Review of patient's allergies indicates no known allergies.  Home Medications   Current Outpatient Rx  Name Route Sig Dispense Refill  . ACETAMINOPHEN 500 MG PO TABS Oral Take 1,000 mg by mouth every 6 (six) hours as needed. For pain       BP 126/70  Pulse 59  Temp(Src) 97.8 F (36.6 C) (Oral)  Resp 16  SpO2 99%  Physical Exam  Constitutional: He appears well-developed and well-nourished. He  appears distressed.       Appears uncomfortable occasionally grimace  HENT:  Head: Normocephalic and atraumatic.  Eyes: EOM are normal. Pupils are equal, round, and reactive to light.  Neck: Normal range of motion. Neck supple.  Cardiovascular: Normal rate and normal heart sounds.   Pulmonary/Chest: Effort normal and breath sounds normal.  Abdominal: Soft. There is no tenderness.  Neurological: He is alert. He has normal reflexes.       Does not answer questions appropriately. Words are clear but answers are inappropriate. Cranial nerves II through XII grossly intact moves all extremities motor is 5 over 5 overall. Follow simple commands    ED Course  Procedures (including critical care time) 3:15 PM patient's exam is essentially unchanged he moves all extremities speaks in nonsensical terms Labs Reviewed  CBC - Abnormal; Notable for the following:    Hemoglobin 12.9 (*)    HCT 37.7 (*)    Platelets 477 (*)    All other components within normal limits  DIFFERENTIAL - Abnormal; Notable for the following:    Neutro Abs 7.8 (*)    All other components within normal limits  URINE RAPID DRUG SCREEN (HOSP PERFORMED)  PROTIME-INR  APTT   No results found.   No diagnosis found.  Results for orders placed during the hospital encounter of 03/31/11  CBC      Component Value Range   WBC 10.4  4.0 - 10.5 (K/uL)  RBC 4.72  4.22 - 5.81 (MIL/uL)   Hemoglobin 12.9 (*) 13.0 - 17.0 (g/dL)   HCT 16.1 (*) 09.6 - 52.0 (%)   MCV 79.9  78.0 - 100.0 (fL)   MCH 27.3  26.0 - 34.0 (pg)   MCHC 34.2  30.0 - 36.0 (g/dL)   RDW 04.5  40.9 - 81.1 (%)   Platelets 477 (*) 150 - 400 (K/uL)  DIFFERENTIAL      Component Value Range   Neutrophils Relative 75  43 - 77 (%)   Neutro Abs 7.8 (*) 1.7 - 7.7 (K/uL)   Lymphocytes Relative 18  12 - 46 (%)   Lymphs Abs 1.8  0.7 - 4.0 (K/uL)   Monocytes Relative 7  3 - 12 (%)   Monocytes Absolute 0.7  0.1 - 1.0 (K/uL)   Eosinophils Relative 0  0 - 5 (%)    Eosinophils Absolute 0.0  0.0 - 0.7 (K/uL)   Basophils Relative 0  0 - 1 (%)   Basophils Absolute 0.0  0.0 - 0.1 (K/uL)  PROTIME-INR      Component Value Range   Prothrombin Time 13.9  11.6 - 15.2 (seconds)   INR 1.05  0.00 - 1.49   APTT      Component Value Range   aPTT 33  24 - 37 (seconds)   Ct Head Wo Contrast  03/31/2011  *RADIOLOGY REPORT*  Clinical Data: Headache for 2 days.  Mental status changes. History of craniotomy for evacuation of a subdural hematoma.  CT HEAD WITHOUT CONTRAST  Technique:  Contiguous axial images were obtained from the base of the skull through the vertex without contrast.  Comparison: Head CT 03/17/2011 and 02/28/2011.  Findings: There is a bulging extra-axial/subdural fluid collection on the left where the patient and the prior subdural hematoma.  I do not see any obvious hemorrhage.  There is mass effect on the adjacent white matter which appears edematous.  Findings suspicious for epidural abscess with adjacent cerebritis.  There is mass effect on the left lateral ventricle and midline shift of 6 mm. There is a second extra-axial fluid collection just posterior to the upper aspect of the craniotomy site which is likely a second area of epidural abscess with adjacent edema in the white matter.  No findings for hemispheric infarction and/or intracranial hemorrhage. The brainstem and cerebellum appear normal.  The CSF spaces around brainstem are maintained.  The bony structures are intact.  The craniotomy defect is noted.  IMPRESSION:  1.  Extra-axial fluid collections on the left suspicious for epidural abscesses with adjacent white matter edema suggesting cerebritis. 2.  6 mm of midline shift. 3.  A head CT with contrast may be helpful for further evaluation.  Critical Value/emergent results were called by telephone at the time of interpretation on 03/31/2011  at 1500 hours.  to  Dr. Ethelda Chick, who verbally acknowledged these results.  Original Report Authenticated By:  P. Loralie Champagne, M.D.   Ct Head Wo Contrast  03/17/2011  *RADIOLOGY REPORT*  Clinical Data: Subdural hematoma.  Craniotomy 02/28/2011  CT HEAD WITHOUT CONTRAST  Technique:  Contiguous axial images were obtained from the base of the skull through the vertex without contrast.  Comparison: 02/28/2011  Findings: Left frontal craniotomy for subdural drainage.  No drain is in place.  Mixed density subdural hematoma left frontal lobe is significantly smaller.  This measures 9 mm in the anterior portion and approximately 10 mm in the midportion.  Mild midline shift measures 5.5 mm  and is significantly improved.  Negative for hydrocephalus.  Negative for acute infarct or mass.  IMPRESSION: Improvement in left frontal subdural hematoma.  There remains a mixed density subdural hematoma measuring approximately 10 mm.  5.5 mm midline shift has improved significantly from the prior study.  Original Report Authenticated By: Camelia Phenes, M.D.     MDM  Spoke with Dr. Phoebe Perch accepts patient in transfer has reviewed the CT scan In light of fact there is no 3100 bed available at [facility] the patient will be transported to the emergency department [place] [facility]. [facility] is aware of the patient. Diagnosis altered mental status   CRITICAL CARE Performed by: Doug Sou   Total critical care time:  Critical care time was exclusive of separately billable procedures and treating other patients.  Critical care was necessary to treat or prevent imminent or life-threatening deterioration.  Critical care was time spent personally by me on the following activities: development of treatment plan with patient and/or surrogate as well as nursing, discussions with consultants, evaluation of patient's response to treatment, examination of patient, obtaining history from patient or surrogate, ordering and performing treatments and interventions, ordering and review of laboratory studies,  ordering and review of radiographic studies, pulse oximetry and re-evaluation of patient's condition.        Doug Sou, MD 03/31/11 1521  Doug Sou, MD 03/31/11 [phone]

## 2011-04-01 DIAGNOSIS — G060 Intracranial abscess and granuloma: Secondary | ICD-10-CM

## 2011-04-01 LAB — GLUCOSE, CAPILLARY: Glucose-Capillary: 116 mg/dL — ABNORMAL HIGH (ref 70–99)

## 2011-04-01 LAB — CBC
HCT: 34.5 % — ABNORMAL LOW (ref 39.0–52.0)
MCV: 82.1 fL (ref 78.0–100.0)
Platelets: 478 10*3/uL — ABNORMAL HIGH (ref 150–400)
RBC: 4.2 MIL/uL — ABNORMAL LOW (ref 4.22–5.81)
WBC: 12.5 10*3/uL — ABNORMAL HIGH (ref 4.0–10.5)

## 2011-04-01 LAB — BASIC METABOLIC PANEL
BUN: 6 mg/dL (ref 6–23)
CO2: 28 mEq/L (ref 19–32)
Chloride: 104 mEq/L (ref 96–112)
Creatinine, Ser: 0.98 mg/dL (ref 0.50–1.35)

## 2011-04-01 LAB — MRSA PCR SCREENING: MRSA by PCR: NEGATIVE

## 2011-04-01 MED ORDER — DEXTROSE 5 % IV SOLN
2.0000 g | Freq: Two times a day (BID) | INTRAVENOUS | Status: DC
  Filled 2011-04-01 (×2): qty 2

## 2011-04-01 MED ORDER — DEXTROSE 5 % IV SOLN
2.0000 g | Freq: Two times a day (BID) | INTRAVENOUS | Status: DC
  Filled 2011-04-01: qty 2

## 2011-04-01 MED ORDER — DEXTROSE 5 % IV SOLN
2.0000 g | Freq: Three times a day (TID) | INTRAVENOUS | Status: DC
  Administered 2011-04-01 – 2011-04-03 (×6): 2 g via INTRAVENOUS
  Filled 2011-04-01 (×8): qty 2

## 2011-04-01 NOTE — Progress Notes (Signed)
Subjective: Aphasic ,   Objective: Vital signs in last 24 hours: Temp:  [97.6 F (36.4 C)-99.5 F (37.5 C)] 98.8 F (37.1 C) (12/12 0320) Pulse Rate:  [56-83] 62  (12/12 0700) Resp:  [10-25] 19  (12/12 0700) BP: (105-129)/(62-102) 116/72 mmHg (12/12 0700) SpO2:  [97 %-100 %] 100 % (12/12 0700) FiO2 (%):  [2 %] 2 % (12/11 2100) Weight:  [84.4 kg (186 lb 1.1 oz)] 186 lb 1.1 oz (84.4 kg) (12/11 2042)  Intake/Output from previous day: 12/11 0701 - 12/12 0700 In: 1600 [I.V.:1600] Out: 2825 [Urine:2125; Blood:200] Intake/Output this shift:    PE - A,A - Follows simple commands - aphasic (expressive much worse than receptive) - sl R facial droop , incision C/D/I  Lab Results:  Basename 04/01/11 0340 03/31/11 1410  WBC 12.5* 10.4  HGB 11.3* 12.9*  HCT 34.5* 37.7*  PLT 478* 477*   BMET  Basename 04/01/11 0340 03/31/11 1456  NA 140 139  K 3.9 3.8  CL 104 102  CO2 28 26  GLUCOSE 115* 115*  BUN 6 10  CREATININE 0.98 1.00  CALCIUM 9.3 9.3    Studies/Results  : cx's pending   Assessment/Plan: On IV abx - consult ID, await C&S results - cont ICU care  LOS: 1 day     Oriyah Lamphear R, MD 04/01/2011, 8:33 AM

## 2011-04-01 NOTE — Progress Notes (Signed)
ANTIBIOTIC CONSULT NOTE - INITIAL  Pharmacy Consult for Cefepime Indication: Subdural abscess  No Known Allergies  Patient Measurements: Height: 5\' 9"  (175.3 cm) Weight: 186 lb 1.1 oz (84.4 kg) IBW/kg (Calculated) : 70.7  Adjusted Body Weight:   Vital Signs: Temp: 99.1 F (37.3 C) (12/12 1519) Temp src: Oral (12/12 1519) BP: 105/62 mmHg (12/12 1400) Pulse Rate: 69  (12/12 1400) Intake/Output from previous day: 12/11 0701 - 12/12 0700 In: 2040 [P.O.:240; I.V.:1600; IV Piggyback:200] Out: 2825 [Urine:2125; Blood:200] Intake/Output from this shift: Total I/O In: 700 [P.O.:240; IV Piggyback:460] Out: -   Labs:  Basename 04/01/11 0340 03/31/11 1456 03/31/11 1410  WBC 12.5* -- 10.4  HGB 11.3* -- 12.9*  PLT 478* -- 477*  LABCREA -- -- --  CREATININE 0.98 1.00 --   Estimated Creatinine Clearance: 99.2 ml/min (by C-G formula based on Cr of 0.98). No results found for this basename: VANCOTROUGH:2,VANCOPEAK:2,VANCORANDOM:2,GENTTROUGH:2,GENTPEAK:2,GENTRANDOM:2,TOBRATROUGH:2,TOBRAPEAK:2,TOBRARND:2,AMIKACINPEAK:2,AMIKACINTROU:2,AMIKACIN:2, in the last 72 hours   Microbiology: Recent Results (from the past 720 hour(s))  FUNGUS CULTURE W SMEAR     Status: Normal (Preliminary result)   Collection Time   03/31/11  6:12 PM      Component Value Range Status Comment   Specimen Description ABSCESS HEAD   Final    Special Requests SUBDURAL HEMATOMA LEFT   Final    Fungal Smear NO YEAST OR FUNGAL ELEMENTS SEEN   Final    Culture CULTURE IN PROGRESS FOR FOUR WEEKS   Final    Report Status PENDING   Incomplete   AFB CULTURE WITH SMEAR     Status: Normal (Preliminary result)   Collection Time   03/31/11  6:12 PM      Component Value Range Status Comment   Specimen Description ABSCESS HEAD   Final    Special Requests SUBDURAL HEMATOMA LEFT   Final    ACID FAST SMEAR NO ACID FAST BACILLI SEEN   Final    Culture     Final    Value: CULTURE WILL BE EXAMINED FOR 6 WEEKS BEFORE ISSUING A  FINAL REPORT   Report Status PENDING   Incomplete   CULTURE, ROUTINE-ABSCESS     Status: Normal (Preliminary result)   Collection Time   03/31/11  6:12 PM      Component Value Range Status Comment   Specimen Description ABSCESS HEAD   Final    Special Requests SUBDURAL HEMATOMA LEFT   Final    Gram Stain     Final    Value: NO WBC SEEN     NO SQUAMOUS EPITHELIAL CELLS SEEN     NO ORGANISMS SEEN   Culture Culture reincubated for better growth   Final    Report Status PENDING   Incomplete   ANAEROBIC CULTURE     Status: Normal (Preliminary result)   Collection Time   03/31/11  6:12 PM      Component Value Range Status Comment   Specimen Description ABSCESS HEAD   Final    Special Requests SUBDURAL HEMATOMA LEFT   Final    Gram Stain     Final    Value: NO WBC SEEN     NO SQUAMOUS EPITHELIAL CELLS SEEN     NO ORGANISMS SEEN   Culture     Final    Value: NO ANAEROBES ISOLATED; CULTURE IN PROGRESS FOR 5 DAYS   Report Status PENDING   Incomplete     Medical History: Past Medical History  Diagnosis Date  . Headache  has subdural hemotoma  . No pertinent past medical history   . Subdural hematoma     Assessment: Patient is a 40 y.o. with subdural abscess.  To change rocephine to cefepime today per ID for pseudomonal coverage.   Plan:  1) Cefepime 2gm IV q8h  Lia Vigilante P 04/01/2011,4:33 PM

## 2011-04-01 NOTE — Consult Note (Signed)
Date of Admission:  03/31/2011  Date of Consult:  04/01/2011  Reason for Consult: subdural abscess Referring Physician: Dr. Phoebe Perch   HPI: Don Palmer is an 40 y.o. male. 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 Phoebe Perch  ON Mar 02, 2011. I cannot find much in way of details in Epic with regards to what preceded that surgery. In any case the patient appears to have in the interim developed a severe headache a week and pain around his incision site. He was evaluated in ED and CTabscess under the dura at the surgical site. He was evlauted in the ED on 28th of November and had CT and was  Dc to home. He returned on December the 11th he developed delirium and exhibited bizarre behavior,. He was found to have an "Extra-axial fluid collections on the left suspicious for epidural abscesses with adjacent white matter edema suggesting cerebritis with 2. 6 mm of midline shift on non contrast CT. Dr. Blanche East took patient to the OR where he encoutered frank pus under the dura which he cultured and irrigated thoroughly. I am not sure if patient may have received antibiotics in the ER or in OR by anethesia. In any case so fare gram stain of pus is without wbc or organism on gram stain, afb or fungal smears. Pt is on rocephin and vancomycin.    Past Medical History  Diagnosis Date  . Headache     has subdural hemotoma  . No pertinent past medical history   . Subdural hematoma     Past Surgical History  Procedure Date  . Shoulder open rotator cuff repair     2010  left shoulder  . Craniotomy 03/02/2011    Procedure: CRANIOTOMY HEMATOMA EVACUATION SUBDURAL;  Surgeon: Clydene Fake;  Location: [facility];  Service: Neurosurgery;  Laterality: Left;  ergies:   No Known Allergies   Medications: I have reviewed patients current medications as documented in Epic Anti-infectives     Start     Dose/Rate Route Frequency Ordered Stop   04/01/11 2200    cefTRIAXone (ROCEPHIN) 2 g in dextrose 5 % 50 mL IVPB  Status:  Discontinued        2 g 100 mL/hr over 30 Minutes Intravenous Every 12 hours 04/01/11 0840 04/01/11 0848   04/01/11 2000   cefTRIAXone (ROCEPHIN) 2 g in dextrose 5 % 50 mL IVPB  Status:  Discontinued        2 g 100 mL/hr over 30 Minutes Intravenous Every 12 hours 04/01/11 0848 04/01/11 1558   04/01/11 0800   cefTRIAXone (ROCEPHIN) 1 g in dextrose 5 % 50 mL IVPB  Status:  Discontinued        1 g 100 mL/hr over 30 Minutes Intravenous Every 12 hours 03/31/11 1907 04/01/11 0840   04/01/11 0600   vancomycin (VANCOCIN) IVPB 1000 mg/200 mL premix  Status:  Discontinued        1,000 mg 200 mL/hr over 60 Minutes Intravenous  Once 03/31/11 1854 03/31/11 2040   03/31/11 1845   cefTRIAXone (ROCEPHIN) 1 g in dextrose 5 % 50 mL IVPB  Status:  Discontinued        1 g 100 mL/hr over 30 Minutes Intravenous  Once 03/31/11 1844 03/31/11 2040   03/31/11 1833   bacitracin 16109 UNITS injection     Comments: AURAND, BRANDI: cabinet override         03/31/11 1833 04/01/11 6045  03/31/11 1830   vancomycin (VANCOCIN) 1 GM/200ML IVPB     Comments: WILLIAMS, ALANA: cabinet override         03/31/11 1830 03/31/11 1831   03/31/11 1830   cefTRIAXone (ROCEPHIN) 1 g in dextrose 5 % 50 mL IVPB  Status:  Discontinued        1 g 100 mL/hr over 30 Minutes Intravenous  Once 03/31/11 1854 03/31/11 2040   03/31/11 1755   50,000 units bacitracin in 0.9% normal saline 250 mL irrigation  Status:  Discontinued          As needed 03/31/11 1811 03/31/11 1859   03/31/11 1724   ceFAZolin (ANCEF) 1-5 GM-% IVPB     Comments: GORDON, THERESA: cabinet override         03/31/11 1724 04/01/11 0529   03/31/11 1722   bacitracin 40981 UNITS injection     Comments: AURAND, BRANDI: cabinet override         03/31/11 1722 04/01/11 0529   03/31/11 0400   vancomycin (VANCOCIN) IVPB 1000 mg/200 mL premix        1,000 mg 200 mL/hr over 60 Minutes Intravenous Every 8  hours 03/31/11 2116            Social History:  reports that he has never smoked. He does not have any smokeless tobacco history on file. He reports that he drinks alcohol. He reports that he does not use illicit drugs.  No family history on file.  As in HPI and primary teams notes otherwise 12 point review of systems is negative  Blood pressure 105/62, pulse 69, temperature 99.1 F (37.3 C), temperature source Oral, resp. rate 22, height 5\' 9"  (1.753 m), weight 186 lb 1.1 oz (84.4 kg), SpO2 100.00%. General: Alert and awake,aphasic HEENT: cranioatomy site with clear bandage,  anicteric sclera, pupils reactive to light and accommodation, EOMI, oropharynx clear and without exudate CVS regular rate, normal r,  no murmur rubs or gallops Chest: clear to auscultation bilaterally, no wheezing, rales or rhonchi Abdomen: soft nontender, nondistended, normal bowel sounds, Extremities: no  clubbing or edema noted bilaterally Skin: no rashes,positive tattoos Neuro: aphasic but following commands, strength and sensation intact   Results for orders placed during the hospital encounter of 03/31/11 (from the past 48 hour(s))  CBC     Status: Abnormal   Collection Time   03/31/11  2:10 PM      Component Value Range Comment   WBC 10.4  4.0 - 10.5 (K/uL)    RBC 4.72  4.22 - 5.81 (MIL/uL)    Hemoglobin 12.9 (*) 13.0 - 17.0 (g/dL)    HCT 19.1 (*) 47.8 - 52.0 (%)    MCV 79.9  78.0 - 100.0 (fL)    MCH 27.3  26.0 - 34.0 (pg)    MCHC 34.2  30.0 - 36.0 (g/dL)    RDW 29.5  62.1 - 30.8 (%)    Platelets 477 (*) 150 - 400 (K/uL)   DIFFERENTIAL     Status: Abnormal   Collection Time   03/31/11  2:10 PM      Component Value Range Comment   Neutrophils Relative 75  43 - 77 (%)    Neutro Abs 7.8 (*) 1.7 - 7.7 (K/uL)    Lymphocytes Relative 18  12 - 46 (%)    Lymphs Abs 1.8  0.7 - 4.0 (K/uL)    Monocytes Relative 7  3 - 12 (%)    Monocytes Absolute 0.7  0.1 -  1.0 (K/uL)    Eosinophils Relative 0  0 - 5  (%)    Eosinophils Absolute 0.0  0.0 - 0.7 (K/uL)    Basophils Relative 0  0 - 1 (%)    Basophils Absolute 0.0  0.0 - 0.1 (K/uL)   PROTIME-INR     Status: Normal   Collection Time   03/31/11  2:56 PM      Component Value Range Comment   Prothrombin Time 13.9  11.6 - 15.2 (seconds)    INR 1.05  0.00 - 1.49    APTT     Status: Normal   Collection Time   03/31/11  2:56 PM      Component Value Range Comment   aPTT 33  24 - 37 (seconds)   BASIC METABOLIC PANEL     Status: Abnormal   Collection Time   03/31/11  2:56 PM      Component Value Range Comment   Sodium 139  135 - 145 (mEq/L)    Potassium 3.8  3.5 - 5.1 (mEq/L)    Chloride 102  96 - 112 (mEq/L)    CO2 26  19 - 32 (mEq/L)    Glucose, Bld 115 (*) 70 - 99 (mg/dL)    BUN 10  6 - 23 (mg/dL)    Creatinine, Ser 1.61  0.50 - 1.35 (mg/dL)    Calcium 9.3  8.4 - 10.5 (mg/dL)    GFR calc non Af Amer >90  >90 (mL/min)    GFR calc Af Amer >90  >90 (mL/min)   FUNGUS CULTURE W SMEAR     Status: Normal (Preliminary result)   Collection Time   03/31/11  6:12 PM      Component Value Range Comment   Specimen Description ABSCESS HEAD      Special Requests SUBDURAL HEMATOMA LEFT      Fungal Smear NO YEAST OR FUNGAL ELEMENTS SEEN      Culture CULTURE IN PROGRESS FOR FOUR WEEKS      Report Status PENDING     AFB CULTURE WITH SMEAR     Status: Normal (Preliminary result)   Collection Time   03/31/11  6:12 PM      Component Value Range Comment   Specimen Description ABSCESS HEAD      Special Requests SUBDURAL HEMATOMA LEFT      ACID FAST SMEAR NO ACID FAST BACILLI SEEN      Culture        Value: CULTURE WILL BE EXAMINED FOR 6 WEEKS BEFORE ISSUING A FINAL REPORT   Report Status PENDING     CULTURE, ROUTINE-ABSCESS     Status: Normal (Preliminary result)   Collection Time   03/31/11  6:12 PM      Component Value Range Comment   Specimen Description ABSCESS HEAD      Special Requests SUBDURAL HEMATOMA LEFT      Gram Stain        Value:  NO WBC SEEN     NO SQUAMOUS EPITHELIAL CELLS SEEN     NO ORGANISMS SEEN   Culture Culture reincubated for better growth      Report Status PENDING     ANAEROBIC CULTURE     Status: Normal (Preliminary result)   Collection Time   03/31/11  6:12 PM      Component Value Range Comment   Specimen Description ABSCESS HEAD      Special Requests SUBDURAL HEMATOMA LEFT      Gram Stain  Value: NO WBC SEEN     NO SQUAMOUS EPITHELIAL CELLS SEEN     NO ORGANISMS SEEN   Culture        Value: NO ANAEROBES ISOLATED; CULTURE IN PROGRESS FOR 5 DAYS   Report Status PENDING     GLUCOSE, CAPILLARY     Status: Normal   Collection Time   03/31/11 11:22 PM      Component Value Range Comment   Glucose-Capillary 84  70 - 99 (mg/dL)   GLUCOSE, CAPILLARY     Status: Normal   Collection Time   04/01/11  3:18 AM      Component Value Range Comment   Glucose-Capillary 90  70 - 99 (mg/dL)   SEDIMENTATION RATE     Status: Abnormal   Collection Time   04/01/11  3:40 AM      Component Value Range Comment   Sed Rate 74 (*) 0 - 16 (mm/hr)   CBC     Status: Abnormal   Collection Time   04/01/11  3:40 AM      Component Value Range Comment   WBC 12.5 (*) 4.0 - 10.5 (K/uL)    RBC 4.20 (*) 4.22 - 5.81 (MIL/uL)    Hemoglobin 11.3 (*) 13.0 - 17.0 (g/dL)    HCT 16.1 (*) 09.6 - 52.0 (%)    MCV 82.1  78.0 - 100.0 (fL)    MCH 26.9  26.0 - 34.0 (pg)    MCHC 32.8  30.0 - 36.0 (g/dL)    RDW 04.5  40.9 - 81.1 (%)    Platelets 478 (*) 150 - 400 (K/uL)   BASIC METABOLIC PANEL     Status: Abnormal   Collection Time   04/01/11  3:40 AM      Component Value Range Comment   Sodium 140  135 - 145 (mEq/L)    Potassium 3.9  3.5 - 5.1 (mEq/L)    Chloride 104  96 - 112 (mEq/L)    CO2 28  19 - 32 (mEq/L)    Glucose, Bld 115 (*) 70 - 99 (mg/dL)    BUN 6  6 - 23 (mg/dL)    Creatinine, Ser 9.14  0.50 - 1.35 (mg/dL)    Calcium 9.3  8.4 - 10.5 (mg/dL)    GFR calc non Af Amer >90  >90 (mL/min)    GFR calc Af Amer >90  >90  (mL/min)   GLUCOSE, CAPILLARY     Status: Abnormal   Collection Time   04/01/11  8:42 AM      Component Value Range Comment   Glucose-Capillary 126 (*) 70 - 99 (mg/dL)       Component Value Date/Time   SDES ABSCESS HEAD 03/31/2011 1812   SDES ABSCESS HEAD 03/31/2011 1812   SDES ABSCESS HEAD 03/31/2011 1812   SDES ABSCESS HEAD 03/31/2011 1812   SPECREQUEST SUBDURAL HEMATOMA LEFT 03/31/2011 1812   SPECREQUEST SUBDURAL HEMATOMA LEFT 03/31/2011 1812   SPECREQUEST SUBDURAL HEMATOMA LEFT 03/31/2011 1812   SPECREQUEST SUBDURAL HEMATOMA LEFT 03/31/2011 1812   CULT CULTURE IN PROGRESS FOR FOUR WEEKS 03/31/2011 1812   CULT CULTURE WILL BE EXAMINED FOR 6 WEEKS BEFORE ISSUING A FINAL REPORT 03/31/2011 1812   CULT Culture reincubated for better growth 03/31/2011 1812   CULT NO ANAEROBES ISOLATED; CULTURE IN PROGRESS FOR 5 DAYS 03/31/2011 1812   REPTSTATUS PENDING 03/31/2011 1812   REPTSTATUS PENDING 03/31/2011 1812   REPTSTATUS PENDING 03/31/2011 1812   REPTSTATUS PENDING 03/31/2011 1812   Ct Head  Wo Contrast  03/31/2011  *RADIOLOGY REPORT*  Clinical Data: Headache for 2 days.  Mental status changes. History of craniotomy for evacuation of a subdural hematoma.  CT HEAD WITHOUT CONTRAST  Technique:  Contiguous axial images were obtained from the base of the skull through the vertex without contrast.  Comparison: Head CT 03/17/2011 and 02/28/2011.  Findings: There is a bulging extra-axial/subdural fluid collection on the left where the patient and the prior subdural hematoma.  I do not see any obvious hemorrhage.  There is mass effect on the adjacent white matter which appears edematous.  Findings suspicious for epidural abscess with adjacent cerebritis.  There is mass effect on the left lateral ventricle and midline shift of 6 mm. There is a second extra-axial fluid collection just posterior to the upper aspect of the craniotomy site which is likely a second area of epidural abscess with adjacent edema  in the white matter.  No findings for hemispheric infarction and/or intracranial hemorrhage. The brainstem and cerebellum appear normal.  The CSF spaces around brainstem are maintained.  The bony structures are intact.  The craniotomy defect is noted.  IMPRESSION:  1.  Extra-axial fluid collections on the left suspicious for epidural abscesses with adjacent white matter edema suggesting cerebritis. 2.  6 mm of midline shift. 3.  A head CT with contrast may be helpful for further evaluation.  Critical Value/emergent results were called by telephone at the time of interpretation on 03/31/2011  at 1500 hours.  to  Dr. Ethelda Chick, who verbally acknowledged these results.  Original Report Authenticated By: P. Loralie Champagne, M.D.     Recent Results (from the past 720 hour(s))  FUNGUS CULTURE W SMEAR     Status: Normal (Preliminary result)   Collection Time   03/31/11  6:12 PM      Component Value Range Status Comment   Specimen Description ABSCESS HEAD   Final    Special Requests SUBDURAL HEMATOMA LEFT   Final    Fungal Smear NO YEAST OR FUNGAL ELEMENTS SEEN   Final    Culture CULTURE IN PROGRESS FOR FOUR WEEKS   Final    Report Status PENDING   Incomplete   AFB CULTURE WITH SMEAR     Status: Normal (Preliminary result)   Collection Time   03/31/11  6:12 PM      Component Value Range Status Comment   Specimen Description ABSCESS HEAD   Final    Special Requests SUBDURAL HEMATOMA LEFT   Final    ACID FAST SMEAR NO ACID FAST BACILLI SEEN   Final    Culture     Final    Value: CULTURE WILL BE EXAMINED FOR 6 WEEKS BEFORE ISSUING A FINAL REPORT   Report Status PENDING   Incomplete   CULTURE, ROUTINE-ABSCESS     Status: Normal (Preliminary result)   Collection Time   03/31/11  6:12 PM      Component Value Range Status Comment   Specimen Description ABSCESS HEAD   Final    Special Requests SUBDURAL HEMATOMA LEFT   Final    Gram Stain     Final    Value: NO WBC SEEN     NO SQUAMOUS EPITHELIAL  CELLS SEEN     NO ORGANISMS SEEN   Culture Culture reincubated for better growth   Final    Report Status PENDING   Incomplete   ANAEROBIC CULTURE     Status: Normal (Preliminary result)   Collection Time   03/31/11  6:12 PM      Component Value Range Status Comment   Specimen Description ABSCESS HEAD   Final    Special Requests SUBDURAL HEMATOMA LEFT   Final    Gram Stain     Final    Value: NO WBC SEEN     NO SQUAMOUS EPITHELIAL CELLS SEEN     NO ORGANISMS SEEN   Culture     Final    Value: NO ANAEROBES ISOLATED; CULTURE IN PROGRESS FOR 5 DAYS   Report Status PENDING   Incomplete      Impression/Recommendation  40 year old African American with hx of rotator cuff tear, recent subdural hematoma who developed abscess at surgical site sp Neurosurgery yesterday   1) Subdural abscess: --agree with vancomycin and will broaden from rocephin to cefepime for pseudomonal coverage --check esr, crp in am --will need protracted course of antibiotics, with at least 6 weeks of parenteral therapy  2)Screening: --check hiv, hep serologies   Thank you so much for this interesting consult,   Acey Lav 04/01/2011, 3:58 PM   [phone] (pager) [phone] (office)

## 2011-04-02 ENCOUNTER — Inpatient Hospital Stay (HOSPITAL_COMMUNITY): Payer: 59

## 2011-04-02 ENCOUNTER — Encounter (HOSPITAL_COMMUNITY): Payer: Self-pay | Admitting: Neurosurgery

## 2011-04-02 LAB — GLUCOSE, CAPILLARY
Glucose-Capillary: 152 mg/dL — ABNORMAL HIGH (ref 70–99)
Glucose-Capillary: 113 mg/dL — ABNORMAL HIGH (ref 70–99)
Glucose-Capillary: 91 mg/dL (ref 70–99)

## 2011-04-02 LAB — C-REACTIVE PROTEIN: CRP: 13.09 mg/dL — ABNORMAL HIGH (ref <0.60)

## 2011-04-02 LAB — HIV ANTIBODY (ROUTINE TESTING W REFLEX): HIV: NONREACTIVE

## 2011-04-02 MED ORDER — DEXAMETHASONE 2 MG PO TABS
2.0000 mg | ORAL_TABLET | Freq: Four times a day (QID) | ORAL | Status: DC
  Administered 2011-04-02 – 2011-04-03 (×4): 2 mg via ORAL
  Filled 2011-04-02 (×9): qty 1

## 2011-04-02 MED ORDER — LEVETIRACETAM 500 MG PO TABS
500.0000 mg | ORAL_TABLET | Freq: Two times a day (BID) | ORAL | Status: DC
  Administered 2011-04-02 – 2011-04-07 (×10): 500 mg via ORAL
  Filled 2011-04-02 (×12): qty 1

## 2011-04-02 MED ORDER — IOHEXOL 300 MG/ML  SOLN
100.0000 mL | Freq: Once | INTRAMUSCULAR | Status: AC | PRN
  Administered 2011-04-02: 100 mL via INTRAVENOUS

## 2011-04-02 MED ORDER — DEXAMETHASONE SODIUM PHOSPHATE 4 MG/ML IJ SOLN: 2.0000 mg | Freq: Four times a day (QID) | INTRAMUSCULAR | Status: DC

## 2011-04-02 NOTE — Progress Notes (Addendum)
Speech Language/Pathology Speech Language Pathology Evaluation Patient Details Name: Don Palmer MRN: [id number] DOB: [birth year] Today's Date: 04/02/2011  Problem List: There is no problem list on file for this patient.   Past Medical History:  Past Medical History  Diagnosis Date  . Headache     has subdural hemotoma  . No pertinent past medical history   . Subdural hematoma    Past Surgical History:  Past Surgical History  Procedure Date  . Shoulder open rotator cuff repair     2010  left shoulder  . Craniotomy 03/02/2011    Procedure: CRANIOTOMY HEMATOMA EVACUATION SUBDURAL;  Surgeon: Clydene Fake;  Location: [facility];  Service: Neurosurgery;  Laterality: Left;  . Craniotomy 03/31/2011    Procedure: CRANIOTOMY HEMATOMA EVACUATION SUBDURAL;  Surgeon: Clydene Fake;  Location: [facility];  Service: Neurosurgery;  Laterality: Left;  Left Craniectomy for Subdural Abcess    SLP Assessment/Plan/Recommendation Assessment Clinical Impression Statement: Pt. presents with L subdural hematoma resulting in the primary deficit of mod-severe expressive and mild-modreceptive aphasia, and minimal cognitive impairments.  Pt. would benefit from ST services in acute care to improve verbal expression and comprehension abilities, improve speech intelligibility, and improve working memory.  1.  Pt. will improve verbal expression abilities through compensatory strategies with moderate assistance.  SLP Recommendation/Assessment: Patient will need skilled Speech Lanaguage Pathology Services in the acute care venue to address identified deficits Problem List: Auditory comprehension;Written expression;Attention;Memory;Problem Solving;Executive Functioning;Thought organization;Reading comprehension Therapy Diagnosis: Aphasia;Cognitive Impairments Plan Speech Therapy Frequency: min 2x/week Duration: 2 weeks Treatment/Interventions: Language facilitation;Environmental controls;Cueing  hierarchy;Cognitive reorganization;Internal/external aids;Functional tasks;Multimodal communcation approach;SLP instruction and feedback;Compensatory strategies;Patient/family education Potential to Achieve Goals: Good Potential Considerations: Ability to learn/carryover information;Family/community support;Co-morbidities Recommendation Recommendations for Other Services: OT consult Follow up Recommendations: Inpatient Rehab (If NOT QUALIFY FOR REHAB, REC: OUTPATIENT) Equipment Recommended: None recommended by OT Individuals Consulted Consulted and Agree with Results and Recommendations: Patient;Family member/caregiver  SLP Goals   1.  Pt. will improve verbal expression abilities through compensatory strategies with moderate assistance.   2.  Pt. will improve comprehension of mildly complex information and identify difficulties with mod verbal assist  3.  Pt. Will Improve communication at the short phrase level  4.  Pt. will improve written communication at the short phrase level with mild verbal cues SLP Evaluation Prior Functioning  Prior Functional Status Cognitive/Linguistic Baseline: Within functional limits Type of Home: Apartment (on third floor) Lives With: Significant other Vocation: Full time employment Cognition Cognition Overall Cognitive Status: Impaired Arousal/Alertness: Awake/alert Orientation Level: Oriented to person;Oriented to place;Oriented to time Attention: Sustained;Selective Focused Attention: Appears intact Sustained Attention: Impaired Sustained Attention Impairment: Verbal basic;Functional complex Selective Attention: Impaired Selective Attention Impairment: Functional complex;Verbal complex Memory: Impaired Memory Impairment: Decreased short term memory Awareness: Impaired Awareness Impairment: Intellectual impairment;Anticipatory impairment;Emergent impairment Problem Solving: Impaired Problem Solving Impairment: Verbal basic;Functional basic;Verbal  complex Executive Function: Self Monitoring;Self Correcting Self Monitoring: Appears intact Self Correcting: Impaired Self Correcting Impairment: Verbal basic Behaviors: Perseveration Safety/Judgment: Impaired Comprehension  Auditory Comprehension Yes/No Questions: Impaired Other Yes/No Questions Comments`: Moderate Commands: Impaired Multistep Basic Commands: 25-49% accurate Conversation: Simple Interfering Components: Attention;Processing speed;Working Radio broadcast assistant: Extra processing time;Increased volume;Repetition;Pausing;Slowed speech;Stressing words;Visual/Gestural cues Visual Recognition/Discrimination Discrimination: Within Function Limits Reading Comprehension Reading Status: Not tested Expression  Expression Primary Mode of Expression: Verbal Verbal Expression Initiation: No impairment Automatic Speech: Day of week Level of Generative/Spontaneous Verbalization: Conversation Naming: Impairment Responsive: 51-75% accurate Confrontation: Impaired (66% naming room objects) Convergent: Not tested Verbal Errors:  Perseveration;Aware of errors Pragmatics: No impairment Interfering Components: Attention Effective Techniques: Semantic cues;Sentence completion;Phonemic cues Non-Verbal Means of Communication: Not applicable Written Expression Dominant Hand: Left Written Expression: Exceptions to [facility] Dictation Ability: Phrase;Sentence Oral/Motor  Oral Motor/Sensory Function Labial ROM: Reduced right Labial Symmetry: Within Functional Limits Labial Strength: Within Functional Limits Lingual ROM: Within Functional Limits Lingual Symmetry: Within Functional Limits Lingual Strength: Within Functional Limits Facial ROM: Within Functional Limits Facial Symmetry: Within Functional Limits Facial Strength: Within Functional Limits Facial Sensation: Within Functional Limits Velum: Within Functional Limits Mandible: Within Functional Limits Motor  Speech Intelligibility: Intelligibility reduced  Ulice Dash, SLP student  Breck Coons Lonell Face.Ed [employer] [phone] 04/02/2011

## 2011-04-02 NOTE — Plan of Care (Signed)
Problem: Phase II Progression Outcomes Goal: Other Phase II Outcomes/Goals Completed Speech, Language, and Cognitive Evaluation Pt. will benefit from ST services in acute care setting See full report

## 2011-04-02 NOTE — Progress Notes (Signed)
Subjective: No new complaints  Objective: Weight change:   Intake/Output Summary (Last 24 hours) at 04/02/11 1240 Last data filed at 04/02/11 1000  Gross per 24 hour  Intake   2100 ml  Output      0 ml  Net   2100 ml   Blood pressure 111/68, pulse 72, temperature 97.5 F (36.4 C), temperature source Oral, resp. rate 17, height 5\' 9"  (1.753 m), weight 186 lb 1.1 oz (84.4 kg), SpO2 98.00%. Temp:  [97.5 F (36.4 C)-99.1 F (37.3 C)] 97.5 F (36.4 C) (12/13 0800) Pulse Rate:  [56-84] 72  (12/13 1000) Resp:  [14-24] 17  (12/13 1000) BP: (93-119)/(52-72) 111/68 mmHg (12/13 1000) SpO2:  [96 %-100 %] 98 % (12/13 1000)  Physical Exam: General: Alert and awake fluent speech, oriented lucid  HEENT: cranioatomy site with clear bandage, anicteric sclera, pupils reactive to light and accommodation, EOMI, oropharynx clear and without exudate  CVS regular rate, normal r, no murmur rubs or gallops  Chest: clear to auscultation bilaterally, no wheezing, rales or rhonchi  Abdomen: soft nontender, nondistended, normal bowel sounds,  Extremities: no clubbing or edema noted bilaterally  Skin: no rashes,positive tattoos  Neuro: nonfocal  Lab Results:  Basename 04/01/11 0340 03/31/11 1410  WBC 12.5* 10.4  HGB 11.3* 12.9*  HCT 34.5* 37.7*  PLT 478* 477*   BMET  Basename 04/01/11 0340 03/31/11 1456  NA 140 139  K 3.9 3.8  CL 104 102  CO2 28 26  GLUCOSE 115* 115*  BUN 6 10  CREATININE 0.98 1.00  CALCIUM 9.3 9.3    Micro Results: Recent Results (from the past 240 hour(s))  FUNGUS CULTURE W SMEAR     Status: Normal (Preliminary result)   Collection Time   03/31/11  6:12 PM      Component Value Range Status Comment   Specimen Description ABSCESS HEAD   Final    Special Requests SUBDURAL HEMATOMA LEFT   Final    Fungal Smear NO YEAST OR FUNGAL ELEMENTS SEEN   Final    Culture CULTURE IN PROGRESS FOR FOUR WEEKS   Final    Report Status PENDING   Incomplete   AFB CULTURE WITH SMEAR      Status: Normal (Preliminary result)   Collection Time   03/31/11  6:12 PM      Component Value Range Status Comment   Specimen Description ABSCESS HEAD   Final    Special Requests SUBDURAL HEMATOMA LEFT   Final    ACID FAST SMEAR NO ACID FAST BACILLI SEEN   Final    Culture     Final    Value: CULTURE WILL BE EXAMINED FOR 6 WEEKS BEFORE ISSUING A FINAL REPORT   Report Status PENDING   Incomplete   CULTURE, ROUTINE-ABSCESS     Status: Normal (Preliminary result)   Collection Time   03/31/11  6:12 PM      Component Value Range Status Comment   Specimen Description ABSCESS HEAD   Final    Special Requests SUBDURAL HEMATOMA LEFT   Final    Gram Stain     Final    Value: NO WBC SEEN     NO SQUAMOUS EPITHELIAL CELLS SEEN     NO ORGANISMS SEEN   Culture ABUNDANT GRAM NEGATIVE RODS   Final    Report Status PENDING   Incomplete   ANAEROBIC CULTURE     Status: Normal (Preliminary result)   Collection Time   03/31/11  6:12 PM  Component Value Range Status Comment   Specimen Description ABSCESS HEAD   Final    Special Requests SUBDURAL HEMATOMA LEFT   Final    Gram Stain     Final    Value: NO WBC SEEN     NO SQUAMOUS EPITHELIAL CELLS SEEN     NO ORGANISMS SEEN   Culture     Final    Value: NO ANAEROBES ISOLATED; CULTURE IN PROGRESS FOR 5 DAYS   Report Status PENDING   Incomplete   MRSA PCR SCREENING     Status: Normal   Collection Time   04/01/11  7:00 PM      Component Value Range Status Comment   MRSA by PCR NEGATIVE  NEGATIVE  Final     Studies/Results: Ct Head Wo Contrast  03/31/2011  *RADIOLOGY REPORT*  Clinical Data: Headache for 2 days.  Mental status changes. History of craniotomy for evacuation of a subdural hematoma.  CT HEAD WITHOUT CONTRAST  Technique:  Contiguous axial images were obtained from the base of the skull through the vertex without contrast.  Comparison: Head CT 03/17/2011 and 02/28/2011.  Findings: There is a bulging extra-axial/subdural fluid  collection on the left where the patient and the prior subdural hematoma.  I do not see any obvious hemorrhage.  There is mass effect on the adjacent white matter which appears edematous.  Findings suspicious for epidural abscess with adjacent cerebritis.  There is mass effect on the left lateral ventricle and midline shift of 6 mm. There is a second extra-axial fluid collection just posterior to the upper aspect of the craniotomy site which is likely a second area of epidural abscess with adjacent edema in the white matter.  No findings for hemispheric infarction and/or intracranial hemorrhage. The brainstem and cerebellum appear normal.  The CSF spaces around brainstem are maintained.  The bony structures are intact.  The craniotomy defect is noted.  IMPRESSION:  1.  Extra-axial fluid collections on the left suspicious for epidural abscesses with adjacent white matter edema suggesting cerebritis. 2.  6 mm of midline shift. 3.  A head CT with contrast may be helpful for further evaluation.  Critical Value/emergent results were called by telephone at the time of interpretation on 03/31/2011  at 1500 hours.  to  Dr. Ethelda Chick, who verbally acknowledged these results.  Original Report Authenticated By: P. Loralie Champagne, M.D.   Ct Head Wo Contrast  03/17/2011  *RADIOLOGY REPORT*  Clinical Data: Subdural hematoma.  Craniotomy 02/28/2011  CT HEAD WITHOUT CONTRAST  Technique:  Contiguous axial images were obtained from the base of the skull through the vertex without contrast.  Comparison: 02/28/2011  Findings: Left frontal craniotomy for subdural drainage.  No drain is in place.  Mixed density subdural hematoma left frontal lobe is significantly smaller.  This measures 9 mm in the anterior portion and approximately 10 mm in the midportion.  Mild midline shift measures 5.5 mm and is significantly improved.  Negative for hydrocephalus.  Negative for acute infarct or mass.  IMPRESSION: Improvement in left frontal  subdural hematoma.  There remains a mixed density subdural hematoma measuring approximately 10 mm.  5.5 mm midline shift has improved significantly from the prior study.  Original Report Authenticated By: Camelia Phenes, M.D.   Ct Head W Wo Contrast  04/02/2011  *RADIOLOGY REPORT*  Clinical Data: Subdural abscess.  Post craniotomy.  CT HEAD WITHOUT AND WITH CONTRAST  Technique:  Contiguous axial images were obtained from the base of the skull  through the vertex without and with intravenous contrast.  Contrast: OMNIPAQUE IOHEXOL 300 MG/ML IV SOLN  Comparison: 03/31/2011.  Findings: Recent left frontal craniectomy for drainage of left- sided subdural empyema.  In the left frontal region, there is a complex extra axial rim enhancing fluid collection with maximal transverse dimension of 4.8 x 1.3 cm.  In the superior left parietal region, extra-axial complex fluid collection with peripheral enhancement measures 3.5 x 1.3 cm.  These findings are suggestive of recurrent/residual subdural empyema.  Enhancement of the adjacent sulci most notable left parietal lobe (cerebritis is a possibility).  Vasogenic edema at both of these levels.  Brain extends through the craniotomy site.  Adjacent to the craniotomy site there is a complex gas and fluid containing collection with peripheral enhancement measuring 5.1 x 0.9 cm. This may contain surgical packing material.  Separate from this is a superiorly located fluid and gas containing collection measuring 4.9 x 2 cm. Although this may represent simple postoperative changes, infection is not excluded.  Diffuse edema of the left temporalis muscle.  Left frontal collection and vasogenic edema cause mass effect upon the left lateral ventricle with midline shift to the right by 5.6 mm.  The major dural sinuses remain patent.  On the precontrast motion degraded images, no obvious hemorrhage is noted.  The mild edema extending into the left lateral basal ganglia and left  internal capsule is made the related to vasogenic edema extending into this region.  Infarct not entirely excluded.  IMPRESSION: Postoperative changes with residual/recurrent left hemispheric subdural empyemas and local mass effect suspected as discussed above.  Original Report Authenticated By: Fuller Canada, M.D.    Antibiotics:  Anti-infectives     Start     Dose/Rate Route Frequency Ordered Stop   04/01/11 2200   cefTRIAXone (ROCEPHIN) 2 g in dextrose 5 % 50 mL IVPB  Status:  Discontinued        2 g 100 mL/hr over 30 Minutes Intravenous Every 12 hours 04/01/11 0840 04/01/11 0848   04/01/11 2000   cefTRIAXone (ROCEPHIN) 2 g in dextrose 5 % 50 mL IVPB  Status:  Discontinued        2 g 100 mL/hr over 30 Minutes Intravenous Every 12 hours 04/01/11 0848 04/01/11 1558   04/01/11 1700   ceFEPIme (MAXIPIME) 2 g in dextrose 5 % 50 mL IVPB        2 g 100 mL/hr over 30 Minutes Intravenous Every 8 hours 04/01/11 1638     04/01/11 0800   cefTRIAXone (ROCEPHIN) 1 g in dextrose 5 % 50 mL IVPB  Status:  Discontinued        1 g 100 mL/hr over 30 Minutes Intravenous Every 12 hours 03/31/11 1907 04/01/11 0840   04/01/11 0600   vancomycin (VANCOCIN) IVPB 1000 mg/200 mL premix  Status:  Discontinued        1,000 mg 200 mL/hr over 60 Minutes Intravenous  Once 03/31/11 1854 03/31/11 2040   03/31/11 1845   cefTRIAXone (ROCEPHIN) 1 g in dextrose 5 % 50 mL IVPB  Status:  Discontinued        1 g 100 mL/hr over 30 Minutes Intravenous  Once 03/31/11 1844 03/31/11 2040   03/31/11 1833   bacitracin 21308 UNITS injection     Comments: AURAND, BRANDI: cabinet override         03/31/11 1833 04/01/11 0644   03/31/11 1830   vancomycin (VANCOCIN) 1 GM/200ML IVPB     Comments: Mayford Knife,  ALANA: cabinet override         03/31/11 1830 03/31/11 1831   03/31/11 1830   cefTRIAXone (ROCEPHIN) 1 g in dextrose 5 % 50 mL IVPB  Status:  Discontinued        1 g 100 mL/hr over 30 Minutes Intravenous  Once 03/31/11 1854  03/31/11 2040   03/31/11 1755   50,000 units bacitracin in 0.9% normal saline 250 mL irrigation  Status:  Discontinued          As needed 03/31/11 1811 03/31/11 1859   03/31/11 1724   ceFAZolin (ANCEF) 1-5 GM-% IVPB     Comments: GORDON, THERESA: cabinet override         03/31/11 1724 04/01/11 0529   03/31/11 1722   bacitracin 30865 UNITS injection     Comments: AURAND, BRANDI: cabinet override         03/31/11 1722 04/01/11 0529   03/31/11 0400   vancomycin (VANCOCIN) IVPB 1000 mg/200 mL premix        1,000 mg 200 mL/hr over 60 Minutes Intravenous Every 8 hours 03/31/11 2116            Medications: Scheduled Meds:   . ceFEPime (MAXIPIME) IV  2 g Intravenous Q8H  . dexamethasone  2 mg Oral Q6H  . docusate sodium  100 mg Oral BID  . insulin aspart  0-9 Units Subcutaneous Q4H  . levETIRAcetam  500 mg Oral BID  . vancomycin  1,000 mg Intravenous Q8H  . DISCONTD: cefTRIAXone (ROCEPHIN)  IV  2 g Intravenous Q12H  . DISCONTD: dexamethasone  2 mg Other Q6H  . DISCONTD: dexamethasone  4 mg Intravenous Q6H  . DISCONTD: levetiracetam  500 mg Intravenous Q12H   Continuous Infusions:   . 0.9 % NaCl with KCl 20 mEq / L 100 mL/hr at 04/02/11 1000   PRN Meds:.acetaminophen, acetaminophen, bisacodyl, HYDROcodone-acetaminophen, iohexol, labetalol, magnesium hydroxide, morphine, ondansetron (ZOFRAN) IV, promethazine, promethazine  Assessment/Plan: Don Palmer is a 40 y.o. male African American with hx of rotator cuff tear, recent subdural hematoma who developed abscess at surgical site sp Neurosurgery yesterday   1) Subdural abscess: with GNR seen on culture so far --esr 94 --dc vancomycin --continue cefepime and adjust based on cultures --will need protracted course of antibiotics, with at least 6 weeks of parenteral therapy   2)Screening:  --hiv negative, hep serologies pending      LOS: 2 days   Acey Lav 04/02/2011, 12:40 PM

## 2011-04-02 NOTE — Progress Notes (Signed)
Patient ID: Don Palmer, male   DOB: 1970/06/01, 40 y.o.   MRN: [id number] BP 119/58  Pulse 72  Temp(Src) 98.4 F (36.9 C) (Oral)  Resp 24  Ht 5\' 9"  (1.753 m)  Wt 84.4 kg (186 lb 1.1 oz)  BMI 27.48 kg/m2  SpO2 97% Alert, oriented x4. Mild confusion with commands, but is able to follow most. Perrl, full eom Symmetric smile and facies Tongue and uvula midline No drift Moving all extremities well Dressing blood stained, dry

## 2011-04-02 NOTE — Progress Notes (Signed)
Subjective: Patient with no c/o,   Conversant - no anomia, much improved today -   Objective: Vital signs in last 24 hours: Temp:  [97.5 F (36.4 C)-99.1 F (37.3 C)] 97.5 F (36.4 C) (12/13 0800) Pulse Rate:  [56-84] 56  (12/13 0900) Resp:  [14-24] 16  (12/13 0900) BP: (93-119)/(52-72) 97/60 mmHg (12/13 0900) SpO2:  [96 %-100 %] 96 % (12/13 0900)  Intake/Output from previous day: 12/12 0701 - 12/13 0700 In: 1950 [P.O.:240; I.V.:800; IV Piggyback:910] Out: -  Intake/Output this shift: Total I/O In: 410 [I.V.:100; IV Piggyback:310] Out: -   PE - no anomia - speach fluent - much improved aphasia - FC all 4 - no drift - slight R facial droop - but improved  Lab Results:  Basename 04/01/11 0340 03/31/11 1410  WBC 12.5* 10.4  HGB 11.3* 12.9*  HCT 34.5* 37.7*  PLT 478* 477*   BMET  Basename 04/01/11 0340 03/31/11 1456  NA 140 139  K 3.9 3.8  CL 104 102  CO2 28 26  GLUCOSE 115* 115*  BUN 6 10  CREATININE 0.98 1.00  CALCIUM 9.3 9.3   Cultures  ABSCESS HEAD Special Requests SUBDURAL HEMATOMA LEFT   Culture ABUNDANT GRAM NEGATIVE RODS   Studies/Results: Ct Head W Wo Contrast  04/02/2011  *RADIOLOGY REPORT*  Clinical Data: Subdural abscess.  Post craniotomy.  CT HEAD WITHOUT AND WITH CONTRAST  Technique:  Contiguous axial images were obtained from the base of the skull through the vertex without and with intravenous contrast.  Contrast: OMNIPAQUE IOHEXOL 300 MG/ML IV SOLN  Comparison: 03/31/2011.  Findings: Recent left frontal craniectomy for drainage of left- sided subdural empyema.  In the left frontal region, there is a complex extra axial rim enhancing fluid collection with maximal transverse dimension of 4.8 x 1.3 cm.  In the superior left parietal region, extra-axial complex fluid collection with peripheral enhancement measures 3.5 x 1.3 cm.  These findings are suggestive of recurrent/residual subdural empyema.  Enhancement of the adjacent sulci most notable left  parietal lobe (cerebritis is a possibility).  Vasogenic edema at both of these levels.  Brain extends through the craniotomy site.  Adjacent to the craniotomy site there is a complex gas and fluid containing collection with peripheral enhancement measuring 5.1 x 0.9 cm. This may contain surgical packing material.  Separate from this is a superiorly located fluid and gas containing collection measuring 4.9 x 2 cm. Although this may represent simple postoperative changes, infection is not excluded.  Diffuse edema of the left temporalis muscle.  Left frontal collection and vasogenic edema cause mass effect upon the left lateral ventricle with midline shift to the right by 5.6 mm.  The major dural sinuses remain patent.  On the precontrast motion degraded images, no obvious hemorrhage is noted.  The mild edema extending into the left lateral basal ganglia and left internal capsule is made the related to vasogenic edema extending into this region.  Infarct not entirely excluded.  IMPRESSION: Postoperative changes with residual/recurrent left hemispheric subdural empyemas and local mass effect suspected as discussed above.  Original Report Authenticated By: Fuller Canada, M.D.    Assessment/Plan: Pt improved - CT as expected - cultures positive - await final report - appreciate ID input CPM  LOS: 2 days     Don Palmer R, MD 04/02/2011, 10:00 AM
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
## 2011-04-02 NOTE — Progress Notes (Signed)
PT Screen  PT screen completed along with a basic balance screen.  The patient has no acute PT needs or follow up needs at this time.  PT to sign off. Thank you for the referral.  Lurena Joiner B. Alias Villagran, PT, DPT [phone]

## 2011-04-03 DIAGNOSIS — G060 Intracranial abscess and granuloma: Secondary | ICD-10-CM

## 2011-04-03 LAB — GLUCOSE, CAPILLARY
Glucose-Capillary: 106 mg/dL — ABNORMAL HIGH (ref 70–99)
Glucose-Capillary: 105 mg/dL — ABNORMAL HIGH (ref 70–99)
Glucose-Capillary: 173 mg/dL — ABNORMAL HIGH (ref 70–99)

## 2011-04-03 LAB — CULTURE, ROUTINE-ABSCESS: Gram Stain: NONE SEEN

## 2011-04-03 MED ORDER — DEXAMETHASONE 2 MG PO TABS
2.0000 mg | ORAL_TABLET | Freq: Three times a day (TID) | ORAL | Status: DC
  Administered 2011-04-03 – 2011-04-06 (×9): 2 mg via ORAL
  Filled 2011-04-03 (×12): qty 1

## 2011-04-03 MED ORDER — CEFTRIAXONE SODIUM 2 G IJ SOLR
2.0000 g | Freq: Two times a day (BID) | INTRAMUSCULAR | Status: DC
  Administered 2011-04-03 – 2011-04-06 (×6): 2 g via INTRAVENOUS
  Filled 2011-04-03 (×7): qty 2

## 2011-04-03 NOTE — Progress Notes (Signed)
Patient vision re-assessed. Saccades, smooth pursuits, vergence and peripheral vision all tested and appear to be [facility]. Still unable to read, but feel this is related to processing as opposed to oculomotor deficit.  Patient continues to be I with ambulation and self-care. No further needs for OT at this time. OT signing off.  Thank you, Glendale Chard    OTR/L Pager: [phone] 04/03/2011

## 2011-04-03 NOTE — Progress Notes (Signed)
Subjective: Patient reports no new c/o  Objective: Vital signs in last 24 hours: Temp:  [97.7 F (36.5 C)-98.6 F (37 C)] 97.7 F (36.5 C) (12/14 0400) Pulse Rate:  [53-116] 53  (12/14 0700) Resp:  [16-24] 16  (12/14 0700) BP: (96-119)/(57-79) 96/59 mmHg (12/14 0700) SpO2:  [96 %-99 %] 98 % (12/14 0700) Weight:  [83 kg (182 lb 15.7 oz)] 182 lb 15.7 oz (83 kg) (12/14 0600)  Intake/Output from previous day: 12/13 0701 - 12/14 0700 In: 1720 [P.O.:960; I.V.:400; IV Piggyback:360] Out: -  Intake/Output this shift:    PE - A,A,Ox3 - some difficulty with word finding Incision - C/D/I    Assessment/Plan: Continue IV abx - CT Monday am    LOS: 3 days     Larra Crunkleton R, MD 04/03/2011, 9:04 AM

## 2011-04-03 NOTE — Progress Notes (Signed)
Utilization review completed. Ajayla Iglesias, RN, BSN. 04/03/11  

## 2011-04-03 NOTE — Progress Notes (Signed)
Subjective: No new complaints  Objective: Weight change:   Intake/Output Summary (Last 24 hours) at 04/03/11 1754 Last data filed at 04/03/11 1026  Gross per 24 hour  Intake    410 ml  Output      0 ml  Net    410 ml   Blood pressure 108/56, pulse 72, temperature 98.4 F (36.9 C), temperature source Oral, resp. rate 20, height 5\' 9"  (1.753 m), weight 182 lb 15.7 oz (83 kg), SpO2 100.00%. Temp:  [97.7 F (36.5 C)-98.6 F (37 C)] 98.4 F (36.9 C) (12/14 1622) Pulse Rate:  [53-78] 72  (12/14 1500) Resp:  [16-26] 20  (12/14 1500) BP: (96-128)/(56-79) 108/56 mmHg (12/14 1500) SpO2:  [96 %-100 %] 100 % (12/14 1500) Weight:  [182 lb 15.7 oz (83 kg)] 182 lb 15.7 oz (83 kg) (12/14 0600)  Physical Exam: General: Alert and awake fluent speech, oriented lucid  HEENT: cranioatomy site with clear bandage, anicteric sclera, pupils reactive to light and accommodation, EOMI, oropharynx clear and without exudate  CVS regular rate, normal r, no murmur rubs or gallops  Chest: clear to auscultation bilaterally, no wheezing, rales or rhonchi  Abdomen: soft nontender, nondistended, normal bowel sounds,  Extremities: no clubbing or edema noted bilaterally  Skin: no rashes,positive tattoos  Neuro: he has had some subtle aphasia per RN  Lab Results:  Basename 04/01/11 0340  WBC 12.5*  HGB 11.3*  HCT 34.5*  PLT 478*   BMET  Basename 04/01/11 0340  NA 140  K 3.9  CL 104  CO2 28  GLUCOSE 115*  BUN 6  CREATININE 0.98  CALCIUM 9.3    Micro Results: Recent Results (from the past 240 hour(s))  FUNGUS CULTURE W SMEAR     Status: Normal (Preliminary result)   Collection Time   03/31/11  6:12 PM      Component Value Range Status Comment   Specimen Description ABSCESS HEAD   Final    Special Requests SUBDURAL HEMATOMA LEFT   Final    Fungal Smear NO YEAST OR FUNGAL ELEMENTS SEEN   Final    Culture CULTURE IN PROGRESS FOR FOUR WEEKS   Final    Report Status PENDING   Incomplete   AFB  CULTURE WITH SMEAR     Status: Normal (Preliminary result)   Collection Time   03/31/11  6:12 PM      Component Value Range Status Comment   Specimen Description ABSCESS HEAD   Final    Special Requests SUBDURAL HEMATOMA LEFT   Final    ACID FAST SMEAR NO ACID FAST BACILLI SEEN   Final    Culture     Final    Value: CULTURE WILL BE EXAMINED FOR 6 WEEKS BEFORE ISSUING A FINAL REPORT   Report Status PENDING   Incomplete   CULTURE, ROUTINE-ABSCESS     Status: Normal   Collection Time   03/31/11  6:12 PM      Component Value Range Status Comment   Specimen Description ABSCESS HEAD   Final    Special Requests SUBDURAL HEMATOMA LEFT   Final    Gram Stain     Final    Value: NO WBC SEEN     NO SQUAMOUS EPITHELIAL CELLS SEEN     NO ORGANISMS SEEN   Culture ABUNDANT ENTEROBACTER AEROGENES   Final    Report Status 04/03/2011 FINAL   Final    Organism ID, Bacteria ENTEROBACTER AEROGENES   Final   ANAEROBIC CULTURE  Status: Normal (Preliminary result)   Collection Time   03/31/11  6:12 PM      Component Value Range Status Comment   Specimen Description ABSCESS HEAD   Final    Special Requests SUBDURAL HEMATOMA LEFT   Final    Gram Stain     Final    Value: NO WBC SEEN     NO SQUAMOUS EPITHELIAL CELLS SEEN     NO ORGANISMS SEEN   Culture     Final    Value: NO ANAEROBES ISOLATED; CULTURE IN PROGRESS FOR 5 DAYS   Report Status PENDING   Incomplete   MRSA PCR SCREENING     Status: Normal   Collection Time   04/01/11  7:00 PM      Component Value Range Status Comment   MRSA by PCR NEGATIVE  NEGATIVE  Final     Studies/Results: Ct Head Wo Contrast  03/31/2011  *RADIOLOGY REPORT*  Clinical Data: Headache for 2 days.  Mental status changes. History of craniotomy for evacuation of a subdural hematoma.  CT HEAD WITHOUT CONTRAST  Technique:  Contiguous axial images were obtained from the base of the skull through the vertex without contrast.  Comparison: Head CT 03/17/2011 and 02/28/2011.   Findings: There is a bulging extra-axial/subdural fluid collection on the left where the patient and the prior subdural hematoma.  I do not see any obvious hemorrhage.  There is mass effect on the adjacent white matter which appears edematous.  Findings suspicious for epidural abscess with adjacent cerebritis.  There is mass effect on the left lateral ventricle and midline shift of 6 mm. There is a second extra-axial fluid collection just posterior to the upper aspect of the craniotomy site which is likely a second area of epidural abscess with adjacent edema in the white matter.  No findings for hemispheric infarction and/or intracranial hemorrhage. The brainstem and cerebellum appear normal.  The CSF spaces around brainstem are maintained.  The bony structures are intact.  The craniotomy defect is noted.  IMPRESSION:  1.  Extra-axial fluid collections on the left suspicious for epidural abscesses with adjacent white matter edema suggesting cerebritis. 2.  6 mm of midline shift. 3.  A head CT with contrast may be helpful for further evaluation.  Critical Value/emergent results were called by telephone at the time of interpretation on 03/31/2011  at 1500 hours.  to  Dr. Ethelda Chick, who verbally acknowledged these results.  Original Report Authenticated By: P. Loralie Champagne, M.D.   Ct Head Wo Contrast  03/17/2011  *RADIOLOGY REPORT*  Clinical Data: Subdural hematoma.  Craniotomy 02/28/2011  CT HEAD WITHOUT CONTRAST  Technique:  Contiguous axial images were obtained from the base of the skull through the vertex without contrast.  Comparison: 02/28/2011  Findings: Left frontal craniotomy for subdural drainage.  No drain is in place.  Mixed density subdural hematoma left frontal lobe is significantly smaller.  This measures 9 mm in the anterior portion and approximately 10 mm in the midportion.  Mild midline shift measures 5.5 mm and is significantly improved.  Negative for hydrocephalus.  Negative for acute  infarct or mass.  IMPRESSION: Improvement in left frontal subdural hematoma.  There remains a mixed density subdural hematoma measuring approximately 10 mm.  5.5 mm midline shift has improved significantly from the prior study.  Original Report Authenticated By: Camelia Phenes, M.D.   Ct Head W Wo Contrast  04/02/2011  *RADIOLOGY REPORT*  Clinical Data: Subdural abscess.  Post craniotomy.  CT  HEAD WITHOUT AND WITH CONTRAST  Technique:  Contiguous axial images were obtained from the base of the skull through the vertex without and with intravenous contrast.  Contrast: OMNIPAQUE IOHEXOL 300 MG/ML IV SOLN  Comparison: 03/31/2011.  Findings: Recent left frontal craniectomy for drainage of left- sided subdural empyema.  In the left frontal region, there is a complex extra axial rim enhancing fluid collection with maximal transverse dimension of 4.8 x 1.3 cm.  In the superior left parietal region, extra-axial complex fluid collection with peripheral enhancement measures 3.5 x 1.3 cm.  These findings are suggestive of recurrent/residual subdural empyema.  Enhancement of the adjacent sulci most notable left parietal lobe (cerebritis is a possibility).  Vasogenic edema at both of these levels.  Brain extends through the craniotomy site.  Adjacent to the craniotomy site there is a complex gas and fluid containing collection with peripheral enhancement measuring 5.1 x 0.9 cm. This may contain surgical packing material.  Separate from this is a superiorly located fluid and gas containing collection measuring 4.9 x 2 cm. Although this may represent simple postoperative changes, infection is not excluded.  Diffuse edema of the left temporalis muscle.  Left frontal collection and vasogenic edema cause mass effect upon the left lateral ventricle with midline shift to the right by 5.6 mm.  The major dural sinuses remain patent.  On the precontrast motion degraded images, no obvious hemorrhage is noted.  The mild edema  extending into the left lateral basal ganglia and left internal capsule is made the related to vasogenic edema extending into this region.  Infarct not entirely excluded.  IMPRESSION: Postoperative changes with residual/recurrent left hemispheric subdural empyemas and local mass effect suspected as discussed above.  Original Report Authenticated By: Fuller Canada, M.D.    Antibiotics:  Anti-infectives     Start     Dose/Rate Route Frequency Ordered Stop   04/03/11 2200   cefTRIAXone (ROCEPHIN) 2 g in dextrose 5 % 50 mL IVPB        2 g 100 mL/hr over 30 Minutes Intravenous Every 12 hours 04/03/11 1753     04/01/11 2200   cefTRIAXone (ROCEPHIN) 2 g in dextrose 5 % 50 mL IVPB  Status:  Discontinued        2 g 100 mL/hr over 30 Minutes Intravenous Every 12 hours 04/01/11 0840 04/01/11 0848   04/01/11 2000   cefTRIAXone (ROCEPHIN) 2 g in dextrose 5 % 50 mL IVPB  Status:  Discontinued        2 g 100 mL/hr over 30 Minutes Intravenous Every 12 hours 04/01/11 0848 04/01/11 1558   04/01/11 1700   ceFEPIme (MAXIPIME) 2 g in dextrose 5 % 50 mL IVPB  Status:  Discontinued        2 g 100 mL/hr over 30 Minutes Intravenous Every 8 hours 04/01/11 1638 04/03/11 1753   04/01/11 0800   cefTRIAXone (ROCEPHIN) 1 g in dextrose 5 % 50 mL IVPB  Status:  Discontinued        1 g 100 mL/hr over 30 Minutes Intravenous Every 12 hours 03/31/11 1907 04/01/11 0840   04/01/11 0600   vancomycin (VANCOCIN) IVPB 1000 mg/200 mL premix  Status:  Discontinued        1,000 mg 200 mL/hr over 60 Minutes Intravenous  Once 03/31/11 1854 03/31/11 2040   03/31/11 1845   cefTRIAXone (ROCEPHIN) 1 g in dextrose 5 % 50 mL IVPB  Status:  Discontinued  1 g 100 mL/hr over 30 Minutes Intravenous  Once 03/31/11 1844 03/31/11 2040   03/31/11 1833   bacitracin 16109 UNITS injection     Comments: AURAND, BRANDI: cabinet override         03/31/11 1833 04/01/11 0644   03/31/11 1830   vancomycin (VANCOCIN) 1 GM/200ML IVPB      Comments: Worthy Flank: cabinet override         03/31/11 1830 03/31/11 1831   03/31/11 1830   cefTRIAXone (ROCEPHIN) 1 g in dextrose 5 % 50 mL IVPB  Status:  Discontinued        1 g 100 mL/hr over 30 Minutes Intravenous  Once 03/31/11 1854 03/31/11 2040   03/31/11 1755   50,000 units bacitracin in 0.9% normal saline 250 mL irrigation  Status:  Discontinued          As needed 03/31/11 1811 03/31/11 1859   03/31/11 1724   ceFAZolin (ANCEF) 1-5 GM-% IVPB     Comments: GORDON, THERESA: cabinet override         03/31/11 1724 04/01/11 0529   03/31/11 1722   bacitracin 60454 UNITS injection     Comments: AURAND, BRANDI: cabinet override         03/31/11 1722 04/01/11 0529   03/31/11 0400   vancomycin (VANCOCIN) IVPB 1000 mg/200 mL premix  Status:  Discontinued        1,000 mg 200 mL/hr over 60 Minutes Intravenous Every 8 hours 03/31/11 2116 04/02/11 1244          Medications: Scheduled Meds:    . cefTRIAXone (ROCEPHIN)  IV  2 g Intravenous Q12H  . dexamethasone  2 mg Oral Q8H  . docusate sodium  100 mg Oral BID  . insulin aspart  0-9 Units Subcutaneous Q4H  . levETIRAcetam  500 mg Oral BID  . DISCONTD: ceFEPime (MAXIPIME) IV  2 g Intravenous Q8H  . DISCONTD: dexamethasone  2 mg Oral Q6H   Continuous Infusions:    . 0.9 % NaCl with KCl 20 mEq / L 100 mL/hr at 04/02/11 1100   PRN Meds:.acetaminophen, acetaminophen, bisacodyl, HYDROcodone-acetaminophen, labetalol, magnesium hydroxide, morphine, ondansetron (ZOFRAN) IV, promethazine, promethazine  Assessment/Plan: MAYSEN SUDOL is a 40 y.o. male African American with hx of rotator cuff tear, recent subdural hematoma who developed abscess at surgical site sp Neurosurgery  With Enterobacter growing from culture  1) Subdural abscess: with Enterobacter: While this organism is S to ciprofloxacin and patient expressed desire for oral meds, after further consideration of his case, and pharmacokinetics, I would feel much better  treating him with high dose rocephin 2 grams IV q 12 hours for optimal CNS penetration --I will change him to rocephin 2 g iv q 12 hours --if pt is agreeable to IV abx would place PICC< and have case management set him up with IV antibiotics for an additonal 40 days --please have weekly cbc bmp faxed to Dr. Daiva Eves at [phone] --I would be happy to see him in fu in [facility]    2)Screening:  --hiv negative, hep serologies pending  Dr. Drue Second will be covering this weekend and is available for questions.     LOS: 3 days   Acey Lav 04/03/2011, 5:54 PM

## 2011-04-04 LAB — GLUCOSE, CAPILLARY: Glucose-Capillary: 133 mg/dL — ABNORMAL HIGH (ref 70–99)

## 2011-04-04 MED ORDER — TEMAZEPAM 15 MG PO CAPS
15.0000 mg | ORAL_CAPSULE | Freq: Every evening | ORAL | Status: DC | PRN
  Administered 2011-04-04 – 2011-04-06 (×3): 15 mg via ORAL
  Filled 2011-04-04 (×3): qty 1

## 2011-04-04 NOTE — Progress Notes (Signed)
Subjective: Patient reports much better today.  Denies problems with word finding or speech.  Objective: Vital signs in last 24 hours: Temp:  [97.2 F (36.2 C)-98.4 F (36.9 C)] 97.5 F (36.4 C) (12/15 0400) Pulse Rate:  [51-84] 59  (12/15 0900) Resp:  [17-23] 17  (12/15 0600) BP: (86-128)/(46-92) 108/67 mmHg (12/15 0900) SpO2:  [97 %-100 %] 98 % (12/15 0900)  Intake/Output from previous day: 12/14 0701 - 12/15 0700 In: 290 [P.O.:240; IV Piggyback:50] Out: -  Intake/Output this shift: Total I/O In: 240 [P.O.:240] Out: -   Physical Exam: Awake, alert, conversant.  Briskly follows commands all four extremities.  Speech clear and fluent.  Up and walking without difficulty.  Lab Results: No results found for this basename: WBC:2,HGB:2,HCT:2,PLT:2 in the last 72 hours BMET No results found for this basename: NA:2,K:2,CL:2,CO2:2,GLUCOSE:2,BUN:2,CREATININE:2,CALCIUM:2 in the last 72 hours  Studies/Results: No results found.  Assessment/Plan: Transfer to floor.  Head CT Monday AM and if doing well neurologically, anticipate D/C on ABX per ID service.    LOS: 4 days    Dorian Heckle, MD 04/04/2011, 10:11 AM

## 2011-04-05 LAB — ANAEROBIC CULTURE: Gram Stain: NONE SEEN

## 2011-04-05 NOTE — Progress Notes (Signed)
Subjective: Speech much better  Objective: Vital signs in last 24 hours: Temp:  [97.6 F (36.4 C)-98.4 F (36.9 C)] 97.9 F (36.6 C) (12/16 0546) Pulse Rate:  [55-77] 61  (12/16 0546) Resp:  [16-22] 18  (12/16 0546) BP: (96-127)/(56-80) 109/67 mmHg (12/16 0546) SpO2:  [92 %-100 %] 100 % (12/16 0546) Weight change:  Last BM Date: 04/03/11  Intake/Output from previous day: 12/15 0701 - 12/16 0700 In: 1180 [P.O.:1080; IV Piggyback:100] Out: -  Last cbgs: CBG (last 3)   Basename 04/04/11 1603 04/04/11 1131 04/04/11 0801  GLUCAP 133* 127* 116*     Physical Exam No aphasia.  No drift.  If CT stable to improved in am, should be able to D/C home on antibiotics per ID.  Lab Results: No results found for this basename: WBC:2,HGB:2,HCT:2,PLT:2 in the last 72 hours BMET No results found for this basename: NA:2,K:2,CL:2,CO2:2,GLUCOSE:2,BUN:2,CREATININE:2,CALCIUM:2 in the last 72 hours  Studies/Results: No results found.  Medications: I have reviewed the patient's current medications.  Assessment/Plan:   Don Palmer , MD 04/05/2011, 6:37 AM

## 2011-04-06 ENCOUNTER — Inpatient Hospital Stay (HOSPITAL_COMMUNITY): Payer: 59

## 2011-04-06 ENCOUNTER — Encounter (HOSPITAL_COMMUNITY): Payer: Self-pay | Admitting: Radiology

## 2011-04-06 LAB — HEPATITIS PANEL, ACUTE
HCV Ab: NEGATIVE
Hep A IgM: NEGATIVE
Hep B C IgM: NEGATIVE
Hepatitis B Surface Ag: NEGATIVE

## 2011-04-06 MED ORDER — DEXAMETHASONE 2 MG PO TABS
2.0000 mg | ORAL_TABLET | Freq: Two times a day (BID) | ORAL | Status: DC
  Administered 2011-04-06 – 2011-04-07 (×2): 2 mg via ORAL
  Filled 2011-04-06 (×3): qty 1

## 2011-04-06 MED ORDER — CIPROFLOXACIN HCL 750 MG PO TABS
750.0000 mg | ORAL_TABLET | Freq: Two times a day (BID) | ORAL | Status: DC
  Administered 2011-04-06 – 2011-04-07 (×2): 750 mg via ORAL
  Filled 2011-04-06 (×6): qty 1

## 2011-04-06 NOTE — Progress Notes (Signed)
Doing well .No  C/o  Speech fluent - much improved  Temp:  [97.5 F (36.4 C)-98.6 F (37 C)] 97.7 F (36.5 C) (12/17 1006) Pulse Rate:  [53-74] 67  (12/17 1006) Resp:  [18-20] 18  (12/17 1006) BP: (101-124)/(71-81) 119/75 mmHg (12/17 1006) SpO2:  [94 %-100 %] 98 % (12/17 1006)  Incision CDI  *RADIOLOGY REPORT*  Clinical Data: Follow-up subdural empyema.  CT HEAD WITHOUT CONTRAST  Technique: Contiguous axial images were obtained from the base of  the skull through the vertex without contrast.  Comparison: 04/02/2011.  Findings: The patient is status post left frontal craniectomy for  subdural abscess. Brain hernia remains with extension of the brain  laterally through the craniectomy defect. This likely contributes  to less than would normally be expected of 5 mm left to right  shift.  There are two undrained subdural empyema collections which appear  similar to the scan of 04/02/2011. Anterior to the craniectomy  defect, there is a collection with cross-sectional measurements of  46 x 11 mm, perhaps decreased 1-2 mm in thickness from priors.  There is a second undrained subdural empyema posterior and superior  to the craniectomy defect in the posterior temporoparietal region  on the left with cross-sectional measurements essentially  unchanged, measuring 13 x 34 mm. Both are accompanied by marked  underlying vasogenic edema.  Both collections previously mentioned related to the craniotomy  site in the subgaleal space appear improved with less fluid and  less pneumocephalus, consistent with resolving postoperative  change.  IMPRESSION:  Two undrained subdural empyemas appear similar to 04/02/2011. The  more anterior collection measures 46 x 11 mm in cross section  whereas the more superior posterior collection measures 13 x 34 mm.  5 mm of left to right shift is observed. Marked vasogenic edema  accompanies both collections. Left lateral brain hernia persists.  Original  Report Authenticated By: Elsie Stain, M.D.   Plan:pt improved - wean decadron - change to PO abx (cipro) per ID,  prob d/c in am

## 2011-04-06 NOTE — Progress Notes (Signed)
Subjective: No new complaints feels better wants to go home today on po antibiotics  Objective: Weight change:   Intake/Output Summary (Last 24 hours) at 04/06/11 1158 Last data filed at 04/06/11 1100  Gross per 24 hour  Intake    530 ml  Output    300 ml  Net    230 ml   Blood pressure 119/75, pulse 67, temperature 97.7 F (36.5 C), temperature source Oral, resp. rate 18, height 5\' 9"  (1.753 m), weight 182 lb 15.7 oz (83 kg), SpO2 98.00%. Temp:  [97.5 F (36.4 C)-98.6 F (37 C)] 97.7 F (36.5 C) (12/17 1006) Pulse Rate:  [53-74] 67  (12/17 1006) Resp:  [18-20] 18  (12/17 1006) BP: (101-124)/(71-81) 119/75 mmHg (12/17 1006) SpO2:  [94 %-100 %] 98 % (12/17 1006)  Physical Exam: General: Alert and awake fluent speech, oriented lucid  HEENT: cranioatomy site with clear bandage, anicteric sclera, pupils reactive to light and accommodation, EOMI, oropharynx clear and without exudate  CVS regular rate, normal r, no murmur rubs or gallops  Chest: clear to auscultation bilaterally, no wheezing, rales or rhonchi  Abdomen: soft nontender, nondistended, normal bowel sounds,  Extremities: no clubbing or edema noted bilaterally  Skin: no rashes,positive tattoos  Neuro: nonfocal Lab Results: No results found for this basename: WBC:2,HGB:2,HCT:2,PLT:2 in the last 72 hours BMET No results found for this basename: NA:2,K:2,CL:2,CO2:2,GLUCOSE:2,BUN:2,CREATININE:2,CALCIUM:2 in the last 72 hours  Micro Results: Recent Results (from the past 240 hour(s))  FUNGUS CULTURE W SMEAR     Status: Normal (Preliminary result)   Collection Time   03/31/11  6:12 PM      Component Value Range Status Comment   Specimen Description ABSCESS HEAD   Final    Special Requests SUBDURAL HEMATOMA LEFT   Final    Fungal Smear NO YEAST OR FUNGAL ELEMENTS SEEN   Final    Culture CULTURE IN PROGRESS FOR FOUR WEEKS   Final    Report Status PENDING   Incomplete   AFB CULTURE WITH SMEAR     Status: Normal  (Preliminary result)   Collection Time   03/31/11  6:12 PM      Component Value Range Status Comment   Specimen Description ABSCESS HEAD   Final    Special Requests SUBDURAL HEMATOMA LEFT   Final    ACID FAST SMEAR NO ACID FAST BACILLI SEEN   Final    Culture     Final    Value: CULTURE WILL BE EXAMINED FOR 6 WEEKS BEFORE ISSUING A FINAL REPORT   Report Status PENDING   Incomplete   CULTURE, ROUTINE-ABSCESS     Status: Normal   Collection Time   03/31/11  6:12 PM      Component Value Range Status Comment   Specimen Description ABSCESS HEAD   Final    Special Requests SUBDURAL HEMATOMA LEFT   Final    Gram Stain     Final    Value: NO WBC SEEN     NO SQUAMOUS EPITHELIAL CELLS SEEN     NO ORGANISMS SEEN   Culture ABUNDANT ENTEROBACTER AEROGENES   Final    Report Status 04/03/2011 FINAL   Final    Organism ID, Bacteria ENTEROBACTER AEROGENES   Final   ANAEROBIC CULTURE     Status: Normal   Collection Time   03/31/11  6:12 PM      Component Value Range Status Comment   Specimen Description ABSCESS HEAD   Final    Special Requests  SUBDURAL HEMATOMA LEFT   Final    Gram Stain     Final    Value: NO WBC SEEN     NO SQUAMOUS EPITHELIAL CELLS SEEN     NO ORGANISMS SEEN   Culture NO ANAEROBES ISOLATED   Final    Report Status 04/05/2011 FINAL   Final   MRSA PCR SCREENING     Status: Normal   Collection Time   04/01/11  7:00 PM      Component Value Range Status Comment   MRSA by PCR NEGATIVE  NEGATIVE  Final     Studies/Results: Ct Head Wo Contrast  03/31/2011  *RADIOLOGY REPORT*  Clinical Data: Headache for 2 days.  Mental status changes. History of craniotomy for evacuation of a subdural hematoma.  CT HEAD WITHOUT CONTRAST  Technique:  Contiguous axial images were obtained from the base of the skull through the vertex without contrast.  Comparison: Head CT 03/17/2011 and 02/28/2011.  Findings: There is a bulging extra-axial/subdural fluid collection on the left where the patient  and the prior subdural hematoma.  I do not see any obvious hemorrhage.  There is mass effect on the adjacent white matter which appears edematous.  Findings suspicious for epidural abscess with adjacent cerebritis.  There is mass effect on the left lateral ventricle and midline shift of 6 mm. There is a second extra-axial fluid collection just posterior to the upper aspect of the craniotomy site which is likely a second area of epidural abscess with adjacent edema in the white matter.  No findings for hemispheric infarction and/or intracranial hemorrhage. The brainstem and cerebellum appear normal.  The CSF spaces around brainstem are maintained.  The bony structures are intact.  The craniotomy defect is noted.  IMPRESSION:  1.  Extra-axial fluid collections on the left suspicious for epidural abscesses with adjacent white matter edema suggesting cerebritis. 2.  6 mm of midline shift. 3.  A head CT with contrast may be helpful for further evaluation.  Critical Value/emergent results were called by telephone at the time of interpretation on 03/31/2011  at 1500 hours.  to  Dr. Ethelda Chick, who verbally acknowledged these results.  Original Report Authenticated By: P. Loralie Champagne, M.D.   Ct Head Wo Contrast  03/17/2011  *RADIOLOGY REPORT*  Clinical Data: Subdural hematoma.  Craniotomy 02/28/2011  CT HEAD WITHOUT CONTRAST  Technique:  Contiguous axial images were obtained from the base of the skull through the vertex without contrast.  Comparison: 02/28/2011  Findings: Left frontal craniotomy for subdural drainage.  No drain is in place.  Mixed density subdural hematoma left frontal lobe is significantly smaller.  This measures 9 mm in the anterior portion and approximately 10 mm in the midportion.  Mild midline shift measures 5.5 mm and is significantly improved.  Negative for hydrocephalus.  Negative for acute infarct or mass.  IMPRESSION: Improvement in left frontal subdural hematoma.  There remains a mixed  density subdural hematoma measuring approximately 10 mm.  5.5 mm midline shift has improved significantly from the prior study.  Original Report Authenticated By: Camelia Phenes, M.D.   Ct Head W Wo Contrast  04/02/2011  *RADIOLOGY REPORT*  Clinical Data: Subdural abscess.  Post craniotomy.  CT HEAD WITHOUT AND WITH CONTRAST  Technique:  Contiguous axial images were obtained from the base of the skull through the vertex without and with intravenous contrast.  Contrast: OMNIPAQUE IOHEXOL 300 MG/ML IV SOLN  Comparison: 03/31/2011.  Findings: Recent left frontal craniectomy for drainage of  left- sided subdural empyema.  In the left frontal region, there is a complex extra axial rim enhancing fluid collection with maximal transverse dimension of 4.8 x 1.3 cm.  In the superior left parietal region, extra-axial complex fluid collection with peripheral enhancement measures 3.5 x 1.3 cm.  These findings are suggestive of recurrent/residual subdural empyema.  Enhancement of the adjacent sulci most notable left parietal lobe (cerebritis is a possibility).  Vasogenic edema at both of these levels.  Brain extends through the craniotomy site.  Adjacent to the craniotomy site there is a complex gas and fluid containing collection with peripheral enhancement measuring 5.1 x 0.9 cm. This may contain surgical packing material.  Separate from this is a superiorly located fluid and gas containing collection measuring 4.9 x 2 cm. Although this may represent simple postoperative changes, infection is not excluded.  Diffuse edema of the left temporalis muscle.  Left frontal collection and vasogenic edema cause mass effect upon the left lateral ventricle with midline shift to the right by 5.6 mm.  The major dural sinuses remain patent.  On the precontrast motion degraded images, no obvious hemorrhage is noted.  The mild edema extending into the left lateral basal ganglia and left internal capsule is made the related to vasogenic  edema extending into this region.  Infarct not entirely excluded.  IMPRESSION: Postoperative changes with residual/recurrent left hemispheric subdural empyemas and local mass effect suspected as discussed above.  Original Report Authenticated By: Fuller Canada, M.D.    Antibiotics:  Anti-infectives     Start     Dose/Rate Route Frequency Ordered Stop   04/06/11 1200   ciprofloxacin (CIPRO) tablet 750 mg        750 mg Oral 2 times daily 04/06/11 1158     04/03/11 2200   cefTRIAXone (ROCEPHIN) 2 g in dextrose 5 % 50 mL IVPB  Status:  Discontinued        2 g 100 mL/hr over 30 Minutes Intravenous Every 12 hours 04/03/11 1753 04/06/11 1158   04/01/11 2200   cefTRIAXone (ROCEPHIN) 2 g in dextrose 5 % 50 mL IVPB  Status:  Discontinued        2 g 100 mL/hr over 30 Minutes Intravenous Every 12 hours 04/01/11 0840 04/01/11 0848   04/01/11 2000   cefTRIAXone (ROCEPHIN) 2 g in dextrose 5 % 50 mL IVPB  Status:  Discontinued        2 g 100 mL/hr over 30 Minutes Intravenous Every 12 hours 04/01/11 0848 04/01/11 1558   04/01/11 1700   ceFEPIme (MAXIPIME) 2 g in dextrose 5 % 50 mL IVPB  Status:  Discontinued        2 g 100 mL/hr over 30 Minutes Intravenous Every 8 hours 04/01/11 1638 04/03/11 1753   04/01/11 0800   cefTRIAXone (ROCEPHIN) 1 g in dextrose 5 % 50 mL IVPB  Status:  Discontinued        1 g 100 mL/hr over 30 Minutes Intravenous Every 12 hours 03/31/11 1907 04/01/11 0840   04/01/11 0600   vancomycin (VANCOCIN) IVPB 1000 mg/200 mL premix  Status:  Discontinued        1,000 mg 200 mL/hr over 60 Minutes Intravenous  Once 03/31/11 1854 03/31/11 2040   03/31/11 1845   cefTRIAXone (ROCEPHIN) 1 g in dextrose 5 % 50 mL IVPB  Status:  Discontinued        1 g 100 mL/hr over 30 Minutes Intravenous  Once 03/31/11 1844 03/31/11 2040  03/31/11 1833   bacitracin 16109 UNITS injection     Comments: AURAND, BRANDI: cabinet override         03/31/11 1833 04/01/11 0644   03/31/11 1830   vancomycin  (VANCOCIN) 1 GM/200ML IVPB     Comments: Worthy Flank: cabinet override         03/31/11 1830 03/31/11 1831   03/31/11 1830   cefTRIAXone (ROCEPHIN) 1 g in dextrose 5 % 50 mL IVPB  Status:  Discontinued        1 g 100 mL/hr over 30 Minutes Intravenous  Once 03/31/11 1854 03/31/11 2040   03/31/11 1755   50,000 units bacitracin in 0.9% normal saline 250 mL irrigation  Status:  Discontinued          As needed 03/31/11 1811 03/31/11 1859   03/31/11 1724   ceFAZolin (ANCEF) 1-5 GM-% IVPB     Comments: GORDON, THERESA: cabinet override         03/31/11 1724 04/01/11 0529   03/31/11 1722   bacitracin 60454 UNITS injection     Comments: AURAND, BRANDI: cabinet override         03/31/11 1722 04/01/11 0529   03/31/11 0400   vancomycin (VANCOCIN) IVPB 1000 mg/200 mL premix  Status:  Discontinued        1,000 mg 200 mL/hr over 60 Minutes Intravenous Every 8 hours 03/31/11 2116 04/02/11 1244          Medications: Scheduled Meds:    . ciprofloxacin  750 mg Oral BID  . dexamethasone  2 mg Oral Q8H  . docusate sodium  100 mg Oral BID  . levETIRAcetam  500 mg Oral BID  . DISCONTD: cefTRIAXone (ROCEPHIN)  IV  2 g Intravenous Q12H   Continuous Infusions:    . DISCONTD: 0.9 % NaCl with KCl 20 mEq / L 100 mL/hr at 04/02/11 1100   PRN Meds:.acetaminophen, acetaminophen, bisacodyl, HYDROcodone-acetaminophen, labetalol, magnesium hydroxide, morphine, ondansetron (ZOFRAN) IV, promethazine, promethazine, temazepam  Assessment/Plan: Don Palmer is a 40 y.o. male African American with hx of rotator cuff tear, recent subdural hematoma who developed abscess at surgical site sp Neurosurgery  With Enterobacter growing from culture  1) Subdural abscess: with Enterobacter: While this organism is S to ciprofloxacin and patient expressed desire for oral meds, after further consideration of his case, and pharmacokinetics, I would feel much better treating him with high dose rocephin 2 grams IV q 12  hours for optimal CNS penetration. However pt is adamant about not wanting to go hom with picc and wants po abx despite their inferior CNS penetration --will therefore change to 750mg  cipro bid and would treat him for 8 weeks --please call [phone] to arrange followup with me in [facility]    2)Screening:  --hiv negative, hep serologies pending       LOS: 6 days   Acey Lav 04/06/2011, 11:58 AM

## 2011-04-07 MED ORDER — LEVETIRACETAM 500 MG PO TABS: 500.0000 mg | ORAL_TABLET | Freq: Two times a day (BID) | ORAL | Status: DC

## 2011-04-07 MED ORDER — HYDROCODONE-ACETAMINOPHEN 5-325 MG PO TABS: 1.0000 | ORAL_TABLET | Freq: Four times a day (QID) | ORAL | Status: AC | PRN

## 2011-04-07 MED ORDER — CIPROFLOXACIN HCL 750 MG PO TABS: 750.0000 mg | ORAL_TABLET | Freq: Two times a day (BID) | ORAL | Status: AC

## 2011-04-07 MED ORDER — DEXAMETHASONE 2 MG PO TABS: 2.0000 mg | ORAL_TABLET | Freq: Every day | ORAL | Status: AC

## 2011-04-07 NOTE — Discharge Summary (Signed)
Physician Discharge Summary  Patient ID: Don Palmer MRN: [id number] DOB/AGE: 42/20/1972 42 y.o.  Admit date: 03/31/2011 Discharge date: 04/07/2011  Admission Diagnoses: subdural abscess  Discharge Diagnoses:  subdural abscess Active Problems:  * No active hospital problems. *    Discharged Condition: fair  Hospital Course: pt admitted with change of MS and aphagia (had recent left crany for SDH) - ct - fluid collections anterior and post to craniotomy site with frontal edema and mass effect . Pt taken emergently to OR - had craniectomy and evacuation of abscess.  Post op pt in ICU , slowly improved - was on iv abx, ID consulted.  When final cx's were in pt elected to go with po cipro for 8 weeks of treatment.  Ct's show the same fluid collections, sl smaller than pre-op - still brain edema, but less shift.  Pt improved with aphasia - will d/c home - close f/u , long term po cipro, and serial ct's of head.  Consults: ID  Significant Diagnostic Studies:Specimen Description  ABSCESS HEAD  Final  Special Requests  SUBDURAL HEMATOMA LEFT  Final  Gram Stain  Final  Value:  NO WBC SEEN  NO SQUAMOUS EPITHELIAL CELLS SEEN  NO ORGANISMS SEEN  Culture  ABUNDANT ENTEROBACTER AEROGENES  Final  Report Status  04/03/2011 FINAL  Final  Organism ID, Bacteria  ENTEROBACTER AEROGENES  Final    Treatments: surgery: CRANIECTOMY EVACUATION SUBDURAL abscess   Discharge Exam: Blood pressure 119/77, pulse 59, temperature 98.1 F (36.7 C), temperature source Oral, resp. rate 16, height 5\' 9"  (1.753 m), weight 83 kg (182 lb 15.7 oz), SpO2 99.00%. speech fluent - no word finding difficulties now - motor/sens intact - incision c/d/i  Disposition: home  Discharge Orders    Future Appointments: Provider: Department: Dept Phone: Center:   05/04/2011 9:00 AM Acey Lav, MD [facility] [phone] [facility]     Current Discharge Medication List    START taking these  medications   Details  ciprofloxacin (CIPRO) 750 MG tablet Take 1 tablet (750 mg total) by mouth 2 (two) times daily. Qty: 60 tablet, Refills: 2    dexamethasone (DECADRON) 2 MG tablet Take 1 tablet (2 mg total) by mouth daily with breakfast. Qty: 3 tablet, Refills: 0    HYDROcodone-acetaminophen (NORCO) 5-325 MG per tablet Take 1-2 tablets by mouth every 6 (six) hours as needed for pain. Qty: 41 tablet, Refills: 0    levETIRAcetam (KEPPRA) 500 MG tablet Take 1 tablet (500 mg total) by mouth 2 (two) times daily. Qty: 60 tablet, Refills: 6      CONTINUE these medications which have NOT CHANGED   Details  acetaminophen (TYLENOL) 500 MG tablet Take 1,000 mg by mouth every 6 (six) hours as needed. For pain        Follow-up Information    Follow up with Acey Lav, MD on 05/04/2011. (at 9am)    Contact information:   [address] [place] [address] [phone]        dr Mireille Lacombe - Friday (04/09/11)  Signed: Clydene Fake, MD 04/07/2011, 8:46 AM

## 2011-04-07 NOTE — Progress Notes (Signed)
Subjective: No new complaints feels better wants to go home today on po antibiotics  Objective: Weight change:   Intake/Output Summary (Last 24 hours) at 04/07/11 1014 Last data filed at 04/07/11 0856  Gross per 24 hour  Intake    240 ml  Output    300 ml  Net    -60 ml   Blood pressure 119/77, pulse 59, temperature 98.1 F (36.7 C), temperature source Oral, resp. rate 16, height 5\' 9"  (1.753 m), weight 182 lb 15.7 oz (83 kg), SpO2 99.00%. Temp:  [97.8 F (36.6 C)-98.1 F (36.7 C)] 98.1 F (36.7 C) (12/18 0558) Pulse Rate:  [54-70] 59  (12/18 0558) Resp:  [16-18] 16  (12/18 0558) BP: (111-126)/(69-84) 119/77 mmHg (12/18 0558) SpO2:  [95 %-99 %] 99 % (12/18 0558)  Physical Exam: General: Alert and awake fluent speech, oriented lucid  HEENT: cranioatomy site with clear bandage, anicteric sclera, pupils reactive to light and accommodation, EOMI, oropharynx clear and without exudate  CVS regular rate, normal r, no murmur rubs or gallops  Chest: clear to auscultation bilaterally, no wheezing, rales or rhonchi  Abdomen: soft nontender, nondistended, normal bowel sounds,  Extremities: no clubbing or edema noted bilaterally  Skin: no rashes,positive tattoos  Neuro: nonfocal Lab Results: No results found for this basename: WBC:2,HGB:2,HCT:2,PLT:2 in the last 72 hours BMET No results found for this basename: NA:2,K:2,CL:2,CO2:2,GLUCOSE:2,BUN:2,CREATININE:2,CALCIUM:2 in the last 72 hours  Micro Results: Recent Results (from the past 240 hour(s))  FUNGUS CULTURE W SMEAR     Status: Normal (Preliminary result)   Collection Time   03/31/11  6:12 PM      Component Value Range Status Comment   Specimen Description ABSCESS HEAD   Final    Special Requests SUBDURAL HEMATOMA LEFT   Final    Fungal Smear NO YEAST OR FUNGAL ELEMENTS SEEN   Final    Culture CULTURE IN PROGRESS FOR FOUR WEEKS   Final    Report Status PENDING   Incomplete   AFB CULTURE WITH SMEAR     Status: Normal  (Preliminary result)   Collection Time   03/31/11  6:12 PM      Component Value Range Status Comment   Specimen Description ABSCESS HEAD   Final    Special Requests SUBDURAL HEMATOMA LEFT   Final    ACID FAST SMEAR NO ACID FAST BACILLI SEEN   Final    Culture     Final    Value: CULTURE WILL BE EXAMINED FOR 6 WEEKS BEFORE ISSUING A FINAL REPORT   Report Status PENDING   Incomplete   CULTURE, ROUTINE-ABSCESS     Status: Normal   Collection Time   03/31/11  6:12 PM      Component Value Range Status Comment   Specimen Description ABSCESS HEAD   Final    Special Requests SUBDURAL HEMATOMA LEFT   Final    Gram Stain     Final    Value: NO WBC SEEN     NO SQUAMOUS EPITHELIAL CELLS SEEN     NO ORGANISMS SEEN   Culture ABUNDANT ENTEROBACTER AEROGENES   Final    Report Status 04/03/2011 FINAL   Final    Organism ID, Bacteria ENTEROBACTER AEROGENES   Final   ANAEROBIC CULTURE     Status: Normal   Collection Time   03/31/11  6:12 PM      Component Value Range Status Comment   Specimen Description ABSCESS HEAD   Final    Special Requests  SUBDURAL HEMATOMA LEFT   Final    Gram Stain     Final    Value: NO WBC SEEN     NO SQUAMOUS EPITHELIAL CELLS SEEN     NO ORGANISMS SEEN   Culture NO ANAEROBES ISOLATED   Final    Report Status 04/05/2011 FINAL   Final   MRSA PCR SCREENING     Status: Normal   Collection Time   04/01/11  7:00 PM      Component Value Range Status Comment   MRSA by PCR NEGATIVE  NEGATIVE  Final     Studies/Results: Ct Head Wo Contrast  03/31/2011  *RADIOLOGY REPORT*  Clinical Data: Headache for 2 days.  Mental status changes. History of craniotomy for evacuation of a subdural hematoma.  CT HEAD WITHOUT CONTRAST  Technique:  Contiguous axial images were obtained from the base of the skull through the vertex without contrast.  Comparison: Head CT 03/17/2011 and 02/28/2011.  Findings: There is a bulging extra-axial/subdural fluid collection on the left where the patient  and the prior subdural hematoma.  I do not see any obvious hemorrhage.  There is mass effect on the adjacent white matter which appears edematous.  Findings suspicious for epidural abscess with adjacent cerebritis.  There is mass effect on the left lateral ventricle and midline shift of 6 mm. There is a second extra-axial fluid collection just posterior to the upper aspect of the craniotomy site which is likely a second area of epidural abscess with adjacent edema in the white matter.  No findings for hemispheric infarction and/or intracranial hemorrhage. The brainstem and cerebellum appear normal.  The CSF spaces around brainstem are maintained.  The bony structures are intact.  The craniotomy defect is noted.  IMPRESSION:  1.  Extra-axial fluid collections on the left suspicious for epidural abscesses with adjacent white matter edema suggesting cerebritis. 2.  6 mm of midline shift. 3.  A head CT with contrast may be helpful for further evaluation.  Critical Value/emergent results were called by telephone at the time of interpretation on 03/31/2011  at 1500 hours.  to  Dr. Ethelda Chick, who verbally acknowledged these results.  Original Report Authenticated By: P. Loralie Champagne, M.D.   Ct Head Wo Contrast  03/17/2011  *RADIOLOGY REPORT*  Clinical Data: Subdural hematoma.  Craniotomy 02/28/2011  CT HEAD WITHOUT CONTRAST  Technique:  Contiguous axial images were obtained from the base of the skull through the vertex without contrast.  Comparison: 02/28/2011  Findings: Left frontal craniotomy for subdural drainage.  No drain is in place.  Mixed density subdural hematoma left frontal lobe is significantly smaller.  This measures 9 mm in the anterior portion and approximately 10 mm in the midportion.  Mild midline shift measures 5.5 mm and is significantly improved.  Negative for hydrocephalus.  Negative for acute infarct or mass.  IMPRESSION: Improvement in left frontal subdural hematoma.  There remains a mixed  density subdural hematoma measuring approximately 10 mm.  5.5 mm midline shift has improved significantly from the prior study.  Original Report Authenticated By: Camelia Phenes, M.D.   Ct Head W Wo Contrast  04/02/2011  *RADIOLOGY REPORT*  Clinical Data: Subdural abscess.  Post craniotomy.  CT HEAD WITHOUT AND WITH CONTRAST  Technique:  Contiguous axial images were obtained from the base of the skull through the vertex without and with intravenous contrast.  Contrast: OMNIPAQUE IOHEXOL 300 MG/ML IV SOLN  Comparison: 03/31/2011.  Findings: Recent left frontal craniectomy for drainage of  left- sided subdural empyema.  In the left frontal region, there is a complex extra axial rim enhancing fluid collection with maximal transverse dimension of 4.8 x 1.3 cm.  In the superior left parietal region, extra-axial complex fluid collection with peripheral enhancement measures 3.5 x 1.3 cm.  These findings are suggestive of recurrent/residual subdural empyema.  Enhancement of the adjacent sulci most notable left parietal lobe (cerebritis is a possibility).  Vasogenic edema at both of these levels.  Brain extends through the craniotomy site.  Adjacent to the craniotomy site there is a complex gas and fluid containing collection with peripheral enhancement measuring 5.1 x 0.9 cm. This may contain surgical packing material.  Separate from this is a superiorly located fluid and gas containing collection measuring 4.9 x 2 cm. Although this may represent simple postoperative changes, infection is not excluded.  Diffuse edema of the left temporalis muscle.  Left frontal collection and vasogenic edema cause mass effect upon the left lateral ventricle with midline shift to the right by 5.6 mm.  The major dural sinuses remain patent.  On the precontrast motion degraded images, no obvious hemorrhage is noted.  The mild edema extending into the left lateral basal ganglia and left internal capsule is made the related to vasogenic  edema extending into this region.  Infarct not entirely excluded.  IMPRESSION: Postoperative changes with residual/recurrent left hemispheric subdural empyemas and local mass effect suspected as discussed above.  Original Report Authenticated By: Fuller Canada, M.D.    Antibiotics:  Anti-infectives     Start     Dose/Rate Route Frequency Ordered Stop   04/07/11 0000   ciprofloxacin (CIPRO) 750 MG tablet        750 mg Oral 2 times daily 04/07/11 0846 04/17/11 2359   04/06/11 1200   ciprofloxacin (CIPRO) tablet 750 mg        750 mg Oral 2 times daily 04/06/11 1158     04/03/11 2200   cefTRIAXone (ROCEPHIN) 2 g in dextrose 5 % 50 mL IVPB  Status:  Discontinued        2 g 100 mL/hr over 30 Minutes Intravenous Every 12 hours 04/03/11 1753 04/06/11 1158   04/01/11 2200   cefTRIAXone (ROCEPHIN) 2 g in dextrose 5 % 50 mL IVPB  Status:  Discontinued        2 g 100 mL/hr over 30 Minutes Intravenous Every 12 hours 04/01/11 0840 04/01/11 0848   04/01/11 2000   cefTRIAXone (ROCEPHIN) 2 g in dextrose 5 % 50 mL IVPB  Status:  Discontinued        2 g 100 mL/hr over 30 Minutes Intravenous Every 12 hours 04/01/11 0848 04/01/11 1558   04/01/11 1700   ceFEPIme (MAXIPIME) 2 g in dextrose 5 % 50 mL IVPB  Status:  Discontinued        2 g 100 mL/hr over 30 Minutes Intravenous Every 8 hours 04/01/11 1638 04/03/11 1753   04/01/11 0800   cefTRIAXone (ROCEPHIN) 1 g in dextrose 5 % 50 mL IVPB  Status:  Discontinued        1 g 100 mL/hr over 30 Minutes Intravenous Every 12 hours 03/31/11 1907 04/01/11 0840   04/01/11 0600   vancomycin (VANCOCIN) IVPB 1000 mg/200 mL premix  Status:  Discontinued        1,000 mg 200 mL/hr over 60 Minutes Intravenous  Once 03/31/11 1854 03/31/11 2040   03/31/11 1845   cefTRIAXone (ROCEPHIN) 1 g in dextrose 5 % 50 mL  IVPB  Status:  Discontinued        1 g 100 mL/hr over 30 Minutes Intravenous  Once 03/31/11 1844 03/31/11 2040   03/31/11 1833   bacitracin 11914 UNITS  injection     Comments: AURAND, BRANDI: cabinet override         03/31/11 1833 04/01/11 0644   03/31/11 1830   vancomycin (VANCOCIN) 1 GM/200ML IVPB     Comments: Worthy Flank: cabinet override         03/31/11 1830 03/31/11 1831   03/31/11 1830   cefTRIAXone (ROCEPHIN) 1 g in dextrose 5 % 50 mL IVPB  Status:  Discontinued        1 g 100 mL/hr over 30 Minutes Intravenous  Once 03/31/11 1854 03/31/11 2040   03/31/11 1755   50,000 units bacitracin in 0.9% normal saline 250 mL irrigation  Status:  Discontinued          As needed 03/31/11 1811 03/31/11 1859   03/31/11 1724   ceFAZolin (ANCEF) 1-5 GM-% IVPB     Comments: GORDON, THERESA: cabinet override         03/31/11 1724 04/01/11 0529   03/31/11 1722   bacitracin 78295 UNITS injection     Comments: AURAND, BRANDI: cabinet override         03/31/11 1722 04/01/11 0529   03/31/11 0400   vancomycin (VANCOCIN) IVPB 1000 mg/200 mL premix  Status:  Discontinued        1,000 mg 200 mL/hr over 60 Minutes Intravenous Every 8 hours 03/31/11 2116 04/02/11 1244          Medications: Scheduled Meds:    . ciprofloxacin  750 mg Oral BID  . dexamethasone  2 mg Oral Q12H  . docusate sodium  100 mg Oral BID  . levETIRAcetam  500 mg Oral BID  . DISCONTD: cefTRIAXone (ROCEPHIN)  IV  2 g Intravenous Q12H  . DISCONTD: dexamethasone  2 mg Oral Q8H   Continuous Infusions:   PRN Meds:.acetaminophen, acetaminophen, bisacodyl, HYDROcodone-acetaminophen, labetalol, magnesium hydroxide, morphine, ondansetron (ZOFRAN) IV, promethazine, promethazine, temazepam  Assessment/Plan: Don Palmer is a 40 y.o. male African American with hx of rotator cuff tear, recent subdural hematoma who developed abscess at surgical site sp Neurosurgery  With Enterobacter growing from culture  1) Subdural abscess: with Enterobacter: Repeat CT shows presence of two areas. Neurosurgery feels can be managed medically. While this organism is S to ciprofloxacin and  patient had expressed emphatic desire for oral meds, after further consideration of his case, and pharmacokinetics, I would feel much better treating him with high dose rocephin 2 grams IV q 12 hours for optimal CNS penetration. However pt is adamant about not wanting to go hom with picc and wants po abx despite their inferior CNS penetration --will therefore change to 750mg  cipro bid and would treat him for 8 weeks -  Patient has hospital follow-up with me on 05/04/2011  and has my card and I put this information into the discharge manager.        LOS: 7 days   Acey Lav 04/07/2011, 10:13 AM

## 2011-04-07 NOTE — Progress Notes (Signed)
Pt d/c in stable condition. Instructions given to and verbally reviewed with patient. Expressed understanding of follow up care

## 2011-04-07 NOTE — Progress Notes (Signed)
Speech Language/Pathology Speech Pathology:  Treatment Note  Subjective: "I'm fine"   Objective:   Pt. exhibiting intermittently decreased semantics, anomia, disorganized language in conversation with SLP.  Reading comprehension deficits present while answering questions from hospital menu with mild-mod cues needed.  Pt. was unaware of his errors during reading task.  Assessment:  Pt.'s expressive language abilities have improved since last ST visit, however, he continues to exhibit mild difficulty with expressive and receptive language and suspect he will have difficulty with higher level/more complex information once home.  Pt. denies any problems with language abilities and states "I was really bad on Wed and Thurs last week but I'm fine now."   Recommendations:  Recommend pt. have a language assessment with outpatient for higher level.  Discussed this with patient but he politely stated he did not need that but that he would notify his MD if problems arose.    Pain:   none Intervention Required:   No   Goals: Goals Partially Met  Don Palmer [place] M.Ed [employer] [phone]  04/07/2011

## 2011-04-13 ENCOUNTER — Other Ambulatory Visit (HOSPITAL_COMMUNITY): Payer: Self-pay | Admitting: Neurosurgery

## 2011-04-13 DIAGNOSIS — G060 Intracranial abscess and granuloma: Secondary | ICD-10-CM

## 2011-04-17 ENCOUNTER — Ambulatory Visit (HOSPITAL_COMMUNITY): Admission: RE | Admit: 2011-04-17 | Payer: 59 | Source: Ambulatory Visit

## 2011-04-22 ENCOUNTER — Ambulatory Visit (HOSPITAL_COMMUNITY)
Admission: RE | Admit: 2011-04-22 | Discharge: 2011-04-22 | Disposition: A | Discharge status: Home / Self Care | Payer: 59 | Source: Ambulatory Visit | Attending: Neurosurgery | Admitting: Neurosurgery

## 2011-04-22 DIAGNOSIS — Z09 Encounter for follow-up examination after completed treatment for conditions other than malignant neoplasm: Secondary | ICD-10-CM | POA: Insufficient documentation

## 2011-04-22 DIAGNOSIS — I6200 Nontraumatic subdural hemorrhage, unspecified: Secondary | ICD-10-CM | POA: Insufficient documentation

## 2011-04-22 DIAGNOSIS — G060 Intracranial abscess and granuloma: Secondary | ICD-10-CM

## 2011-04-28 LAB — FUNGUS CULTURE W SMEAR: Fungal Smear: NONE SEEN

## 2011-05-04 ENCOUNTER — Inpatient Hospital Stay: Payer: 59 | Admitting: Infectious Disease

## 2011-05-06 ENCOUNTER — Encounter: Payer: Self-pay | Admitting: Infectious Disease

## 2011-05-06 ENCOUNTER — Ambulatory Visit (INDEPENDENT_AMBULATORY_CARE_PROVIDER_SITE_OTHER): Payer: 59 | Admitting: Infectious Disease

## 2011-05-06 DIAGNOSIS — I6200 Nontraumatic subdural hemorrhage, unspecified: Secondary | ICD-10-CM

## 2011-05-06 DIAGNOSIS — G060 Intracranial abscess and granuloma: Secondary | ICD-10-CM

## 2011-05-06 DIAGNOSIS — R4701 Aphasia: Secondary | ICD-10-CM

## 2011-05-06 DIAGNOSIS — S065X9A Traumatic subdural hemorrhage with loss of consciousness of unspecified duration, initial encounter: Secondary | ICD-10-CM | POA: Insufficient documentation

## 2011-05-06 DIAGNOSIS — B9689 Other specified bacterial agents as the cause of diseases classified elsewhere: Secondary | ICD-10-CM

## 2011-05-06 DIAGNOSIS — A498 Other bacterial infections of unspecified site: Secondary | ICD-10-CM

## 2011-05-07 ENCOUNTER — Encounter: Payer: Self-pay | Admitting: Infectious Disease

## 2011-05-07 DIAGNOSIS — A498 Other bacterial infections of unspecified site: Secondary | ICD-10-CM | POA: Insufficient documentation

## 2011-05-07 LAB — CBC WITH DIFFERENTIAL/PLATELET
Basophils Relative: 0 % (ref 0–1)
Eosinophils Absolute: 0.1 10*3/uL (ref 0.0–0.7)
Eosinophils Relative: 1 % (ref 0–5)
HCT: 42.5 % (ref 39.0–52.0)
Hemoglobin: 14.1 g/dL (ref 13.0–17.0)
Lymphs Abs: 2.6 10*3/uL (ref 0.7–4.0)
Neutrophils Relative %: 47 % (ref 43–77)
RDW: 16.1 % — ABNORMAL HIGH (ref 11.5–15.5)
WBC: 5.8 10*3/uL (ref 4.0–10.5)

## 2011-05-07 NOTE — Assessment & Plan Note (Signed)
Culprit organism here

## 2011-05-07 NOTE — Progress Notes (Signed)
Subjective:    Patient ID: Don Palmer, male    DOB: 1970-09-22, 41 y.o.   MRN: [id number]  HPI  Don Palmer is a 41 y.o. male African American with hx of rotator cuff tear, recent subdural hematoma who developed abscess at surgical site sp Neurosurgery WithS  Enterobacter growing from culture. I had treated him with rocephin IV but the patient wished emphatically on oral meds, so I had switched him to twice daily 750mg  twice daily of cipro. He has been seen in Followup twice by Dr. Phoebe Perch. His most recent CT s contrast showed :  CT s contrast :  IMPRESSION:  Interval improvement although incomplete clearance of left frontal  and left parietal extra-axial complex collections and adjacent  brain parenchymal vasogenic edema as detailed above. This is  suggestive of interval improvement of epidural abscess and brain  parenchymal reactive changes/cerebritis.  New thin left temporal - parietal extra-axial low density  collection measuring up to 4 mm. Question sterile versus infected  fluid collection.   He has repeat CT ordered for early February. He feels well. He denies fevers, chills, He has minimal problems with headaches.   Review of Systems  Constitutional: Negative for fever, chills, diaphoresis, activity change, appetite change, fatigue and unexpected weight change.  HENT: Negative for congestion, sore throat, rhinorrhea, sneezing, trouble swallowing and sinus pressure.   Eyes: Negative for photophobia and visual disturbance.  Respiratory: Negative for cough, chest tightness, shortness of breath, wheezing and stridor.   Cardiovascular: Negative for chest pain, palpitations and leg swelling.  Gastrointestinal: Negative for nausea, vomiting, abdominal pain, diarrhea, constipation, blood in stool, abdominal distention and anal bleeding.  Genitourinary: Negative for dysuria, hematuria, flank pain and difficulty urinating.  Musculoskeletal: Negative for myalgias, back pain, joint  swelling, arthralgias and gait problem.  Skin: Negative for color change, pallor, rash and wound.  Neurological: Negative for dizziness, tremors, weakness and light-headedness.  Hematological: Negative for adenopathy. Does not bruise/bleed easily.  Psychiatric/Behavioral: Negative for behavioral problems, confusion, sleep disturbance, dysphoric mood, decreased concentration and agitation.       Objective:   Physical Exam  Constitutional: He is oriented to person, place, and time. He appears well-developed and well-nourished. No distress.  HENT:  Head: Normocephalic and atraumatic.    Mouth/Throat: Oropharynx is clear and moist. No oropharyngeal exudate.  Eyes: Conjunctivae and EOM are normal. Pupils are equal, round, and reactive to light. No scleral icterus.  Neck: Normal range of motion. Neck supple. No JVD present.  Cardiovascular: Normal rate, regular rhythm and normal heart sounds.  Exam reveals no gallop and no friction rub.   No murmur heard. Pulmonary/Chest: Effort normal and breath sounds normal. No respiratory distress. He has no wheezes. He has no rales. He exhibits no tenderness.  Abdominal: He exhibits no distension and no mass. There is no tenderness. There is no rebound and no guarding.  Musculoskeletal: He exhibits no edema and no tenderness.  Lymphadenopathy:    He has no cervical adenopathy.  Neurological: He is alert and oriented to person, place, and time. He has normal reflexes. He exhibits normal muscle tone. Coordination normal.  Skin: Skin is warm and dry. He is not diaphoretic. No erythema. No pallor.  Psychiatric: He has a normal mood and affect. His behavior is normal. Judgment and thought content normal.          Assessment & Plan:  Brain abscess Continue high dose cipro, followup CT early February with fu with me shortly therafter. May  need even more prolonged course of therapy. Hoping to avoid future NS interventions but am a bit bothered by the new  area seen on CT scan  Infection due to Enterobacter cloacae Culprit organism here  Expressive aphasia resolved

## 2011-05-07 NOTE — Assessment & Plan Note (Signed)
Continue high dose cipro, followup CT early February with fu with me shortly therafter. May need even more prolonged course of therapy. Hoping to avoid future NS interventions but am a bit bothered by the new area seen on CT scan

## 2011-05-07 NOTE — Assessment & Plan Note (Signed)
resolved 

## 2011-05-27 ENCOUNTER — Other Ambulatory Visit (HOSPITAL_COMMUNITY): Payer: Self-pay | Admitting: Neurosurgery

## 2011-05-27 ENCOUNTER — Encounter: Payer: Self-pay | Admitting: Infectious Disease

## 2011-05-27 ENCOUNTER — Ambulatory Visit (INDEPENDENT_AMBULATORY_CARE_PROVIDER_SITE_OTHER): Payer: 59 | Admitting: Infectious Disease

## 2011-05-27 DIAGNOSIS — B9689 Other specified bacterial agents as the cause of diseases classified elsewhere: Secondary | ICD-10-CM

## 2011-05-27 DIAGNOSIS — G060 Intracranial abscess and granuloma: Secondary | ICD-10-CM

## 2011-05-27 DIAGNOSIS — A498 Other bacterial infections of unspecified site: Secondary | ICD-10-CM

## 2011-05-27 MED ORDER — CIPROFLOXACIN HCL 750 MG PO TABS: 750.0000 mg | ORAL_TABLET | Freq: Two times a day (BID) | ORAL | Status: DC

## 2011-05-27 NOTE — Assessment & Plan Note (Signed)
See above

## 2011-05-27 NOTE — Assessment & Plan Note (Signed)
Continue cipro, need repeat CT head

## 2011-05-27 NOTE — Progress Notes (Signed)
Subjective:    Patient ID: Don Palmer, male    DOB: 1970/10/04, 41 y.o.   MRN: [id number]  HPI  Don Palmer is a 41 y.o. male African American with hx of rotator cuff tear, recent subdural hematoma who developed abscess at surgical site sp Neurosurgery WithS Enterobacter growing from culture. I had treated him with rocephin IV but the patient wished emphatically on oral meds, so I had switched him to twice daily 750mg  twice daily of cipro. He has been seen in Followup twice by Dr. Phoebe Perch. His most recent CT s contrast showed :  CT s contrast :  IMPRESSION:  Interval improvement although incomplete clearance of left frontal  and left parietal extra-axial complex collections and adjacent  brain parenchymal vasogenic edema as detailed above. This is  suggestive of interval improvement of epidural abscess and brain  parenchymal reactive changes/cerebritis.  New thin left temporal - parietal extra-axial low density  collection measuring up to 4 mm. Question sterile versus infected  fluid collection.  He has repeat CT ordered for early February but there was problem with pre-approval. He seemed to think it was issue with our office which it wastn sicne we didn't order the test  He is very anxious to get implant placed in skull. I warned him that it was critical that infection was cured and that we had period of time off antibiotics without recurrence before I would recommend implantiaotn of such prosthetic material  Review of Systems  Constitutional: Negative for fever, chills, diaphoresis, activity change, appetite change, fatigue and unexpected weight change.  HENT: Negative for congestion, sore throat, rhinorrhea, sneezing, trouble swallowing and sinus pressure.   Eyes: Negative for photophobia and visual disturbance.  Respiratory: Negative for cough, chest tightness, shortness of breath, wheezing and stridor.   Cardiovascular: Negative for chest pain, palpitations and leg swelling.    Gastrointestinal: Negative for nausea, vomiting, abdominal pain, diarrhea, constipation, blood in stool, abdominal distention and anal bleeding.  Genitourinary: Negative for dysuria, hematuria, flank pain and difficulty urinating.  Musculoskeletal: Negative for myalgias, back pain, joint swelling, arthralgias and gait problem.  Skin: Negative for color change, pallor, rash and wound.  Neurological: Negative for dizziness, tremors, weakness and light-headedness.  Hematological: Negative for adenopathy. Does not bruise/bleed easily.  Psychiatric/Behavioral: Negative for behavioral problems, confusion, sleep disturbance, dysphoric mood, decreased concentration and agitation.       Objective:   Physical Exam  Constitutional: He is oriented to person, place, and time. He appears well-developed and well-nourished. No distress.  HENT:  Head: Normocephalic and atraumatic.    Mouth/Throat: Oropharynx is clear and moist. No oropharyngeal exudate.  Eyes: Conjunctivae and EOM are normal. Pupils are equal, round, and reactive to light. No scleral icterus.  Neck: Normal range of motion. Neck supple. No JVD present.  Cardiovascular: Normal rate, regular rhythm and normal heart sounds.  Exam reveals no gallop and no friction rub.   No murmur heard. Pulmonary/Chest: Effort normal and breath sounds normal. No respiratory distress. He has no wheezes. He has no rales. He exhibits no tenderness.  Abdominal: He exhibits no distension and no mass. There is no tenderness. There is no rebound and no guarding.  Musculoskeletal: He exhibits no edema and no tenderness.  Lymphadenopathy:    He has no cervical adenopathy.  Neurological: He is alert and oriented to person, place, and time. He has normal reflexes. He exhibits normal muscle tone. Coordination normal.  Skin: Skin is warm and dry. He is not diaphoretic.  No erythema. No pallor.  Psychiatric: He has a normal mood and affect. His behavior is normal.  Judgment and thought content normal.          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

## 2011-05-29 ENCOUNTER — Ambulatory Visit (HOSPITAL_COMMUNITY)
Admission: RE | Admit: 2011-05-29 | Discharge: 2011-05-29 | Disposition: A | Discharge status: Home / Self Care | Payer: 59 | Source: Ambulatory Visit | Attending: Neurosurgery | Admitting: Neurosurgery

## 2011-05-29 ENCOUNTER — Encounter (HOSPITAL_COMMUNITY): Payer: Self-pay

## 2011-05-29 DIAGNOSIS — G060 Intracranial abscess and granuloma: Secondary | ICD-10-CM

## 2011-05-29 DIAGNOSIS — Z09 Encounter for follow-up examination after completed treatment for conditions other than malignant neoplasm: Secondary | ICD-10-CM | POA: Insufficient documentation

## 2011-05-29 DIAGNOSIS — I6200 Nontraumatic subdural hemorrhage, unspecified: Secondary | ICD-10-CM | POA: Insufficient documentation

## 2011-05-29 MED ORDER — IOHEXOL 300 MG/ML  SOLN
100.0000 mL | Freq: Once | INTRAMUSCULAR | Status: AC | PRN
  Administered 2011-05-29: 100 mL via INTRAVENOUS

## 2011-06-24 ENCOUNTER — Ambulatory Visit (INDEPENDENT_AMBULATORY_CARE_PROVIDER_SITE_OTHER): Payer: 59 | Admitting: Infectious Disease

## 2011-06-24 ENCOUNTER — Encounter: Payer: Self-pay | Admitting: Infectious Disease

## 2011-06-24 DIAGNOSIS — A498 Other bacterial infections of unspecified site: Secondary | ICD-10-CM

## 2011-06-24 DIAGNOSIS — B9689 Other specified bacterial agents as the cause of diseases classified elsewhere: Secondary | ICD-10-CM

## 2011-06-24 DIAGNOSIS — G060 Intracranial abscess and granuloma: Secondary | ICD-10-CM

## 2011-06-24 MED ORDER — CIPROFLOXACIN HCL 750 MG PO TABS: 750.0000 mg | ORAL_TABLET | Freq: Two times a day (BID) | ORAL | Status: DC

## 2011-06-24 NOTE — Assessment & Plan Note (Signed)
Culprit pathogen 

## 2011-06-24 NOTE — Progress Notes (Signed)
Subjective:    Patient ID: Don Palmer, male    DOB: Oct 11, 1970, 41 y.o.   MRN: [id number]  HPI  Don Palmer is a 41 y.o. male African American with hx of rotator cuff tear, recent subdural hematoma who developed abscess at surgical site sp Neurosurgery WithS Enterobacter growing from culture. I had treated him with rocephin IV but the patient wished emphatically on oral meds, so I had switched him to twice daily 750mg  twice daily of cipro.  I extended therapy and he had repeat CT head in February which showed:  Comparison: CT 04/22/2011  Findings: Left frontal craniectomy defect again noted. CSF  collection in the craniectomy defect has improved and now measures  6 mm. This most likely a subdural hygroma. Fluid collection  posterior to the craniectomy defect on the prior study has  resolved.  Epidural fluid collection in the left frontal lobe has improved.  There is dural thickening and enhancement in this area but no well-  defined collection is identified. This is most likely related to  prior epidural abscess which is resolving.  No edema in the brain. No midline shift. No acute hemorrhage.  Ventricle size is normal. Negative for acute infarct or mass.  IMPRESSION:  Improving left frontal epidural fluid collection compatible with  resolution of abscess. There is dural thickening in the area but  the fluid collection has resolved.  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   He is without fevers, chills, nausea or systemic symptoms.  Review of Systems  Constitutional: Negative for fever, chills, diaphoresis, activity change, appetite change, fatigue and unexpected weight change.  HENT: Negative for congestion, sore throat, rhinorrhea, sneezing, trouble swallowing and sinus pressure.   Eyes: Negative for photophobia and visual disturbance.  Respiratory: Negative for cough, chest tightness, shortness of breath, wheezing and stridor.     Cardiovascular: Negative for chest pain, palpitations and leg swelling.  Gastrointestinal: Negative for nausea, vomiting, abdominal pain, diarrhea, constipation, blood in stool, abdominal distention and anal bleeding.  Genitourinary: Negative for dysuria, hematuria, flank pain and difficulty urinating.  Musculoskeletal: Negative for myalgias, back pain, joint swelling, arthralgias and gait problem.  Skin: Negative for color change, pallor, rash and wound.  Neurological: Negative for dizziness, tremors, weakness and light-headedness.  Hematological: Negative for adenopathy. Does not bruise/bleed easily.  Psychiatric/Behavioral: Negative for behavioral problems, confusion, sleep disturbance, dysphoric mood, decreased concentration and agitation.       Objective:   Physical Exam  Constitutional: He is oriented to person, place, and time. He appears well-developed and well-nourished. No distress.  HENT:  Head: Normocephalic and atraumatic.    Mouth/Throat: Oropharynx is clear and moist. No oropharyngeal exudate.  Eyes: Conjunctivae and EOM are normal. Pupils are equal, round, and reactive to light. No scleral icterus.  Neck: Normal range of motion. Neck supple. No JVD present.  Cardiovascular: Normal rate, regular rhythm and normal heart sounds.  Exam reveals no gallop and no friction rub.   No murmur heard. Pulmonary/Chest: Effort normal and breath sounds normal. No respiratory distress. He has no wheezes. He has no rales. He exhibits no tenderness.  Abdominal: He exhibits no distension and no mass. There is no tenderness. There is no rebound and no guarding.  Musculoskeletal: He exhibits no edema and no tenderness.  Lymphadenopathy:    He has no cervical adenopathy.  Neurological: He is alert and oriented to person, place, and time. He has normal reflexes. He exhibits normal muscle tone.  Coordination normal.  Skin: Skin is warm and dry. He is not diaphoretic. No erythema. No pallor.   Psychiatric: He has a normal mood and affect. His behavior is normal. Judgment and thought content normal.          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

## 2011-06-24 NOTE — Assessment & Plan Note (Addendum)
Continue high dose ciprofloxacin while awaiting  repeat imaging in early April

## 2011-08-05 ENCOUNTER — Encounter: Payer: Self-pay | Admitting: Infectious Disease

## 2011-08-05 ENCOUNTER — Ambulatory Visit (INDEPENDENT_AMBULATORY_CARE_PROVIDER_SITE_OTHER): Payer: 59 | Admitting: Infectious Disease

## 2011-08-05 VITALS — BP 124/83 | HR 60 | Temp 98.5°F | Ht 66.0 in | Wt 194.0 lb

## 2011-08-05 DIAGNOSIS — A498 Other bacterial infections of unspecified site: Secondary | ICD-10-CM

## 2011-08-05 DIAGNOSIS — G060 Intracranial abscess and granuloma: Secondary | ICD-10-CM

## 2011-08-05 DIAGNOSIS — B9689 Other specified bacterial agents as the cause of diseases classified elsewhere: Secondary | ICD-10-CM

## 2011-08-05 NOTE — Progress Notes (Signed)
Subjective:    Patient ID: Don Palmer, male    DOB: [birth year], 41 y.o.   MRN: [id number]  HPI  Don Palmer is a 41 y.o. male African American with hx of rotator cuff tear, recent subdural hematoma who developed abscess at surgical site sp Neurosurgery WithS Enterobacter growing from culture. I had treated him with rocephin IV but the patient wished emphatically on oral meds, so I had switched him to twice daily 750mg  twice daily of cipro.  I extended therapy and he had repeat CT head in February which showed:  ] Comparison: CT 04/22/2011  Findings: Left frontal craniectomy defect again noted. CSF  collection in the craniectomy defect has improved and now measures  6 mm. This most likely a subdural hygroma. Fluid collection  posterior to the craniectomy defect on the prior study has  resolved.  Epidural fluid collection in the left frontal lobe has improved.  There is dural thickening and enhancement in this area but no well-  defined collection is identified. This is most likely related to  prior epidural abscess which is resolving.  No edema in the brain. No midline shift. No acute hemorrhage.  Ventricle size is normal. Negative for acute infarct or mass.  IMPRESSION:  Improving left frontal epidural fluid collection compatible with  resolution of abscess. There is dural thickening in the area but  the fluid collection has resolved.  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   I spoke with [name] and MRI done earlier this month showed resolutio of abscess and only minimal enhancement along the dura. We felt reasoanble to stop the antibiotics and reimage in several months. The pt is with normal esr and with no fevers, chills or systemic symptoms  Review of Systems  Constitutional: Negative for fever, chills, diaphoresis, activity change, appetite change, fatigue and unexpected weight change.  HENT: Negative for congestion, sore throat,  rhinorrhea, sneezing, trouble swallowing and sinus pressure.   Eyes: Negative for photophobia and visual disturbance.  Respiratory: Negative for cough, chest tightness, shortness of breath, wheezing and stridor.   Cardiovascular: Negative for chest pain, palpitations and leg swelling.  Gastrointestinal: Negative for nausea, vomiting, abdominal pain, diarrhea, constipation, blood in stool, abdominal distention and anal bleeding.  Genitourinary: Negative for dysuria, hematuria, flank pain and difficulty urinating.  Musculoskeletal: Negative for myalgias, back pain, joint swelling, arthralgias and gait problem.  Skin: Negative for color change, pallor, rash and wound.  Neurological: Negative for dizziness, tremors, weakness and light-headedness.  Hematological: Negative for adenopathy. Does not bruise/bleed easily.  Psychiatric/Behavioral: Negative for behavioral problems, confusion, sleep disturbance, dysphoric mood, decreased concentration and agitation.       Objective:   Physical Exam  Constitutional: He is oriented to person, place, and time. He appears well-developed and well-nourished. No distress.  HENT:  Head: Normocephalic and atraumatic.    Mouth/Throat: Oropharynx is clear and moist. No oropharyngeal exudate.  Eyes: Conjunctivae and EOM are normal. Pupils are equal, round, and reactive to light. No scleral icterus.  Neck: Normal range of motion. Neck supple. No JVD present.  Cardiovascular: Normal rate, regular rhythm and normal heart sounds.  Exam reveals no gallop and no friction rub.   No murmur heard. Pulmonary/Chest: Effort normal and breath sounds normal. No respiratory distress. He has no wheezes. He has no rales. He exhibits no tenderness.  Abdominal: He exhibits no distension and no mass. There is no tenderness. There is no rebound and no guarding.  Musculoskeletal: He exhibits no edema and no tenderness.  Lymphadenopathy:    He has no cervical adenopathy.    Neurological: He is alert and oriented to person, place, and time. He has normal reflexes. He exhibits normal muscle tone. Coordination normal.  Skin: Skin is warm and dry. He is not diaphoretic. No erythema. No pallor.  Psychiatric: He has a normal mood and affect. His behavior is normal. Judgment and thought content normal.          Assessment & Plan:  Brain abscess Clinically and radiographically seems resolved. 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

## 2011-08-05 NOTE — Assessment & Plan Note (Signed)
Clinically and radiographically seems resolved. Will see what repeat MRI shows OFF antibiotics in 3 months

## 2011-08-05 NOTE — Assessment & Plan Note (Signed)
Was culprit organism

## 2011-10-21 ENCOUNTER — Encounter: Payer: Self-pay | Admitting: Infectious Disease

## 2011-10-21 ENCOUNTER — Ambulatory Visit (INDEPENDENT_AMBULATORY_CARE_PROVIDER_SITE_OTHER): Payer: 59 | Admitting: Infectious Disease

## 2011-10-21 VITALS — BP 112/71 | HR 65 | Temp 97.9°F | Wt 194.5 lb

## 2011-10-21 DIAGNOSIS — R4701 Aphasia: Secondary | ICD-10-CM

## 2011-10-21 DIAGNOSIS — G060 Intracranial abscess and granuloma: Secondary | ICD-10-CM

## 2011-10-21 NOTE — Progress Notes (Signed)
Subjective:    Patient ID: Don Palmer, male    DOB: [birth year], 41 y.o.   MRN: [id number]  HPI  Don Palmer is a 41 y.o. male African American with hx of rotator cuff tear, recent subdural hematoma who developed abscess at surgical site sp Neurosurgery WithS Enterobacter growing from culture. I had treated him with rocephin IV but the patient wished emphatically on oral meds, so I had switched him to twice daily 750mg  twice daily of cipro.  I extended therapy and he had repeat CT head in February which showed:  ]  Comparison: CT 04/22/2011  Findings: Left frontal craniectomy defect again noted. CSF  collection in the craniectomy defect has improved and now measures  6 mm. This most likely a subdural hygroma. Fluid collection  posterior to the craniectomy defect on the prior study has  resolved.  Epidural fluid collection in the left frontal lobe has improved.  There is dural thickening and enhancement in this area but no well-  defined collection is identified. This is most likely related to  prior epidural abscess which is resolving.  No edema in the brain. No midline shift. No acute hemorrhage.  Ventricle size is normal. Negative for acute infarct or mass.  IMPRESSION:  Improving left frontal epidural fluid collection compatible with  resolution of abscess. There is dural thickening in the area but  the fluid collection has resolved.  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  I spoke with [name] and MRI done in April showed resolutio of abscess and only minimal enhancement along the dura. We felt reasoanble to stop the antibiotics and reimage in several months. The pt was with normal esr near normal crp in January  and with no fevers, chills or systemic symptoms, He returns today for followup having had another MRI 10 days ago with Dr. Blanche East (I do not have that report yet). He is again without fevers, chills nausea or systemic symptoms. He was  not eager to have blood work today and I did not insist on it.   Review of Systems  Constitutional: Negative for fever, chills, diaphoresis, activity change, appetite change, fatigue and unexpected weight change.  HENT: Negative for congestion, sore throat, rhinorrhea, sneezing, trouble swallowing and sinus pressure.   Eyes: Negative for photophobia and visual disturbance.  Respiratory: Negative for cough, chest tightness, shortness of breath, wheezing and stridor.   Cardiovascular: Negative for chest pain, palpitations and leg swelling.  Gastrointestinal: Negative for nausea, vomiting, abdominal pain, diarrhea, constipation, blood in stool, abdominal distention and anal bleeding.  Genitourinary: Negative for dysuria, hematuria, flank pain and difficulty urinating.  Musculoskeletal: Negative for myalgias, back pain, joint swelling, arthralgias and gait problem.  Skin: Negative for color change, pallor, rash and wound.  Neurological: Negative for dizziness, tremors, weakness and light-headedness.  Hematological: Negative for adenopathy. Does not bruise/bleed easily.  Psychiatric/Behavioral: Negative for behavioral problems, confusion, disturbed wake/sleep cycle, dysphoric mood, decreased concentration and agitation.       Objective:   Physical Exam  Constitutional: He is oriented to person, place, and time. He appears well-developed and well-nourished. No distress.  HENT:  Head: Normocephalic and atraumatic.    Mouth/Throat: Oropharynx is clear and moist. No oropharyngeal exudate.  Eyes: Conjunctivae and EOM are normal. Pupils are equal, round, and reactive to light. No scleral icterus.  Neck: Normal range of motion. Neck supple. No JVD present.  Cardiovascular: Normal rate, regular rhythm and normal heart sounds.  Exam  reveals no gallop and no friction rub.   No murmur heard. Pulmonary/Chest: Effort normal and breath sounds normal. No respiratory distress. He has no wheezes. He has no  rales. He exhibits no tenderness.  Abdominal: He exhibits no distension and no mass. There is no tenderness. There is no rebound and no guarding.  Musculoskeletal: He exhibits no edema and no tenderness.  Lymphadenopathy:    He has no cervical adenopathy.  Neurological: He is alert and oriented to person, place, and time. He has normal reflexes. He exhibits normal muscle tone. Coordination normal.  Skin: Skin is warm and dry. He is not diaphoretic. No erythema. No pallor.  Psychiatric: He has a normal mood and affect. His behavior is normal. Judgment and thought content normal.          Assessment & Plan:  Brain abscess I need to get records from Dr. Blanche East re the repeat MRI. Continue to observe off antibiotics unless there is something troubling with the MRI and bring back in 6 months.  Expressive aphasia Resolved

## 2011-10-21 NOTE — Assessment & Plan Note (Signed)
Resolved

## 2011-10-21 NOTE — Assessment & Plan Note (Signed)
I need to get records from Dr. Blanche East re the repeat MRI. Continue to observe off antibiotics unless there is something troubling with the MRI and bring back in 6 months.

## 2011-12-02 ENCOUNTER — Other Ambulatory Visit (HOSPITAL_COMMUNITY): Payer: Self-pay | Admitting: Neurosurgery

## 2011-12-02 DIAGNOSIS — S065X9A Traumatic subdural hemorrhage with loss of consciousness of unspecified duration, initial encounter: Secondary | ICD-10-CM

## 2011-12-08 ENCOUNTER — Other Ambulatory Visit (HOSPITAL_COMMUNITY): Payer: 59

## 2012-01-14 ENCOUNTER — Other Ambulatory Visit: Payer: Self-pay | Admitting: Neurosurgery

## 2012-01-15 ENCOUNTER — Other Ambulatory Visit (HOSPITAL_COMMUNITY): Payer: 59

## 2012-01-18 ENCOUNTER — Encounter (HOSPITAL_COMMUNITY): Payer: Self-pay | Admitting: Pharmacist

## 2012-01-22 NOTE — Progress Notes (Signed)
Per Dr. Jean Rosenthal patient will need type and screen for PAT

## 2012-01-25 ENCOUNTER — Encounter (HOSPITAL_COMMUNITY)
Admission: RE | Admit: 2012-01-25 | Discharge: 2012-01-25 | Disposition: A | Discharge status: Home / Self Care | Payer: 59 | Source: Ambulatory Visit | Attending: Neurosurgery | Admitting: Neurosurgery

## 2012-01-25 ENCOUNTER — Encounter (HOSPITAL_COMMUNITY): Payer: Self-pay

## 2012-01-25 HISTORY — DX: Unspecified intracranial injury with loss of consciousness of unspecified duration, initial encounter: S06.9X9A

## 2012-01-25 LAB — URINALYSIS, ROUTINE W REFLEX MICROSCOPIC
Bilirubin Urine: NEGATIVE
Glucose, UA: NEGATIVE mg/dL
Hgb urine dipstick: NEGATIVE
Ketones, ur: NEGATIVE mg/dL
Protein, ur: NEGATIVE mg/dL

## 2012-01-25 LAB — CBC WITH DIFFERENTIAL/PLATELET
Basophils Relative: 1 % (ref 0–1)
Eosinophils Absolute: 0.1 10*3/uL (ref 0.0–0.7)
Eosinophils Relative: 1 % (ref 0–5)
Hemoglobin: 16.1 g/dL (ref 13.0–17.0)
Lymphs Abs: 2 10*3/uL (ref 0.7–4.0)
MCH: 28.4 pg (ref 26.0–34.0)
MCHC: 34.6 g/dL (ref 30.0–36.0)
MCV: 82 fL (ref 78.0–100.0)
Monocytes Relative: 8 % (ref 3–12)
RBC: 5.67 MIL/uL (ref 4.22–5.81)

## 2012-01-25 LAB — BASIC METABOLIC PANEL
BUN: 11 mg/dL (ref 6–23)
CO2: 24 mEq/L (ref 19–32)
Calcium: 9.7 mg/dL (ref 8.4–10.5)
Glucose, Bld: 104 mg/dL — ABNORMAL HIGH (ref 70–99)
Potassium: 4.3 mEq/L (ref 3.5–5.1)
Sodium: 140 mEq/L (ref 135–145)

## 2012-01-25 LAB — TYPE AND SCREEN: ABO/RH(D): O POS

## 2012-01-25 NOTE — Progress Notes (Signed)
I spoke with Lurena Joiner at [facility] office and asked her to have Dr Phoebe Perch to sign orders and to re order PT, PTT, these were released but not drawn due to no signature.

## 2012-01-25 NOTE — Pre-Procedure Instructions (Signed)
[address] EDGEL DEGNAN  01/25/2012   Your procedure is scheduled on:  Monday, October 14th.  Report to [facility] at 5:30 AM.  Call this number if you have problems the morning of surgery: [phone]   Remember:   Do not eat food or drink any liquid:After Midnight.   Take these medications morning of surgery with A SIP OF WATER: NA    Do not wear jewelry, make-up or nail polish.  Do not wear lotions, powders, or perfumes. You may wear deodorant.  Do not shave 48 hours prior to surgery. Men may shave face and neck.  Do not bring valuables to the hospital.  Contacts, dentures or bridgework may not be worn into surgery.  Leave suitcase in the car. After surgery it may be brought to your room.  For patients admitted to the hospital, checkout time is 11:00 AM the day of discharge.   Patients discharged the day of surgery will not be allowed to drive home.  Name and phone number of your driver: NA  Special Instructions: Shower using CHG 2 nights before surgery and the night before surgery.  If you shower the day of surgery use CHG.  Use special wash - you have one bottle of CHG for all showers.  You should use approximately 1/3 of the bottle for each shower.   Please read over the following fact sheets that you were given: Pain Booklet, Coughing and Deep Breathing, Blood Transfusion Information and Surgical Site Infection Prevention

## 2012-01-26 NOTE — Progress Notes (Signed)
Dr. Doreen Beam office called & notified that he needs to sign/release orders & that PT/PTT needs to re-ordered due to RN released too early- not drawn, will need DOS. Left VM for Jessica-Dr. Phoebe Perch' scheduler.

## 2012-02-03 MED ORDER — CEFAZOLIN SODIUM-DEXTROSE 2-3 GM-% IV SOLR
2.0000 g | INTRAVENOUS | Status: AC
  Administered 2012-02-04: 2 g via INTRAVENOUS
  Filled 2012-02-03: qty 50

## 2012-02-04 ENCOUNTER — Encounter (HOSPITAL_COMMUNITY): Payer: Self-pay | Admitting: *Deleted

## 2012-02-04 ENCOUNTER — Encounter (HOSPITAL_COMMUNITY)
Admission: RE | Disposition: A | Discharge status: Home / Self Care | Payer: Self-pay | Source: Ambulatory Visit | Attending: Neurosurgery

## 2012-02-04 ENCOUNTER — Observation Stay (HOSPITAL_COMMUNITY)
Admission: RE | Admit: 2012-02-04 | Discharge: 2012-02-05 | Disposition: A | Discharge status: Home / Self Care | Payer: 59 | Source: Ambulatory Visit | Attending: Neurosurgery | Admitting: Neurosurgery

## 2012-02-04 ENCOUNTER — Encounter (HOSPITAL_COMMUNITY): Payer: Self-pay | Admitting: Anesthesiology

## 2012-02-04 ENCOUNTER — Ambulatory Visit (HOSPITAL_COMMUNITY): Payer: 59 | Admitting: Anesthesiology

## 2012-02-04 DIAGNOSIS — R51 Headache: Secondary | ICD-10-CM | POA: Insufficient documentation

## 2012-02-04 DIAGNOSIS — Z01812 Encounter for preprocedural laboratory examination: Secondary | ICD-10-CM | POA: Insufficient documentation

## 2012-02-04 DIAGNOSIS — M952 Other acquired deformity of head: Principal | ICD-10-CM | POA: Insufficient documentation

## 2012-02-04 HISTORY — PX: CRANIOTOMY: SHX93

## 2012-02-04 LAB — APTT: aPTT: 34 seconds (ref 24–37)

## 2012-02-04 LAB — PROTIME-INR
INR: 0.96 (ref 0.00–1.49)
Prothrombin Time: 12.7 seconds (ref 11.6–15.2)

## 2012-02-04 SURGERY — CRANIOTOMY BONE FLAP/PROSTHETIC PLATE
Anesthesia: General | Site: Head | Laterality: Left | Wound class: Clean

## 2012-02-04 MED ORDER — NEOSTIGMINE METHYLSULFATE 1 MG/ML IJ SOLN
INTRAMUSCULAR | Status: DC | PRN
  Administered 2012-02-04: 5 mg via INTRAVENOUS

## 2012-02-04 MED ORDER — SODIUM CHLORIDE 0.9 % IR SOLN
Status: DC | PRN
  Administered 2012-02-04: 11:00:00

## 2012-02-04 MED ORDER — ONDANSETRON HCL 4 MG/2ML IJ SOLN
INTRAMUSCULAR | Status: DC | PRN
  Administered 2012-02-04: 4 mg via INTRAVENOUS

## 2012-02-04 MED ORDER — DOCUSATE SODIUM 100 MG PO CAPS
100.0000 mg | ORAL_CAPSULE | Freq: Two times a day (BID) | ORAL | Status: DC
  Administered 2012-02-04 (×2): 100 mg via ORAL
  Filled 2012-02-04 (×4): qty 1

## 2012-02-04 MED ORDER — HYDROMORPHONE HCL PF 1 MG/ML IJ SOLN
0.2500 mg | INTRAMUSCULAR | Status: DC | PRN
  Administered 2012-02-04 (×2): 0.5 mg via INTRAVENOUS

## 2012-02-04 MED ORDER — ARTIFICIAL TEARS OP OINT
TOPICAL_OINTMENT | OPHTHALMIC | Status: DC | PRN
  Administered 2012-02-04: 1 via OPHTHALMIC

## 2012-02-04 MED ORDER — HYDROCODONE-ACETAMINOPHEN 5-325 MG PO TABS: 1.0000 | ORAL_TABLET | ORAL | Status: DC | PRN

## 2012-02-04 MED ORDER — LIDOCAINE HCL (CARDIAC) 20 MG/ML IV SOLN
INTRAVENOUS | Status: DC | PRN
  Administered 2012-02-04: 100 mg via INTRAVENOUS

## 2012-02-04 MED ORDER — MIDAZOLAM HCL 5 MG/5ML IJ SOLN
INTRAMUSCULAR | Status: DC | PRN
  Administered 2012-02-04 (×2): 1 mg via INTRAVENOUS

## 2012-02-04 MED ORDER — FENTANYL CITRATE 0.05 MG/ML IJ SOLN
INTRAMUSCULAR | Status: DC | PRN
  Administered 2012-02-04: 75 ug via INTRAVENOUS
  Administered 2012-02-04: 50 ug via INTRAVENOUS
  Administered 2012-02-04: 75 ug via INTRAVENOUS

## 2012-02-04 MED ORDER — DEXTROSE 5 % IV SOLN
INTRAVENOUS | Status: DC | PRN
  Administered 2012-02-04: 11:00:00 via INTRAVENOUS

## 2012-02-04 MED ORDER — MORPHINE SULFATE 2 MG/ML IJ SOLN
2.0000 mg | INTRAMUSCULAR | Status: DC | PRN
  Administered 2012-02-04: 4 mg via INTRAVENOUS
  Administered 2012-02-05: 2 mg via INTRAVENOUS
  Filled 2012-02-04 (×3): qty 1

## 2012-02-04 MED ORDER — LACTATED RINGERS IV SOLN
INTRAVENOUS | Status: DC | PRN
  Administered 2012-02-04 (×2): via INTRAVENOUS

## 2012-02-04 MED ORDER — THROMBIN 5000 UNITS EX KIT
PACK | CUTANEOUS | Status: DC | PRN
  Administered 2012-02-04 (×2): 5000 [IU] via TOPICAL

## 2012-02-04 MED ORDER — MAGNESIUM HYDROXIDE 400 MG/5ML PO SUSP: 30.0000 mL | Freq: Every day | ORAL | Status: DC | PRN

## 2012-02-04 MED ORDER — HYDROMORPHONE HCL PF 1 MG/ML IJ SOLN
INTRAMUSCULAR | Status: AC
  Filled 2012-02-04: qty 1

## 2012-02-04 MED ORDER — ONDANSETRON HCL 4 MG/2ML IJ SOLN: 4.0000 mg | INTRAMUSCULAR | Status: DC | PRN

## 2012-02-04 MED ORDER — ACETAMINOPHEN 325 MG PO TABS: 650.0000 mg | ORAL_TABLET | ORAL | Status: DC | PRN

## 2012-02-04 MED ORDER — LACTATED RINGERS IV SOLN
INTRAVENOUS | Status: DC
  Administered 2012-02-04 (×2): via INTRAVENOUS

## 2012-02-04 MED ORDER — METOCLOPRAMIDE HCL 5 MG/ML IJ SOLN: 10.0000 mg | Freq: Once | INTRAMUSCULAR | Status: DC | PRN

## 2012-02-04 MED ORDER — MORPHINE SULFATE 4 MG/ML IJ SOLN
INTRAMUSCULAR | Status: AC
  Administered 2012-02-04: 4 mg
  Filled 2012-02-04: qty 1

## 2012-02-04 MED ORDER — OXYCODONE-ACETAMINOPHEN 5-325 MG PO TABS
1.0000 | ORAL_TABLET | ORAL | Status: DC | PRN
Start: 2012-02-04 — End: 2012-02-05
  Administered 2012-02-04 (×2): 1 via ORAL
  Filled 2012-02-04 (×3): qty 1

## 2012-02-04 MED ORDER — SODIUM CHLORIDE 0.9 % IV SOLN
INTRAVENOUS | Status: AC
  Filled 2012-02-04: qty 500

## 2012-02-04 MED ORDER — ACETAMINOPHEN 650 MG RE SUPP: 650.0000 mg | RECTAL | Status: DC | PRN

## 2012-02-04 MED ORDER — PROMETHAZINE HCL 25 MG PO TABS: 12.5000 mg | ORAL_TABLET | ORAL | Status: DC | PRN

## 2012-02-04 MED ORDER — HEMOSTATIC AGENTS (NO CHARGE) OPTIME
TOPICAL | Status: DC | PRN
  Administered 2012-02-04: 1 via TOPICAL

## 2012-02-04 MED ORDER — LACTATED RINGERS IV SOLN: INTRAVENOUS | Status: DC | PRN

## 2012-02-04 MED ORDER — OXYCODONE HCL 5 MG/5ML PO SOLN: 5.0000 mg | Freq: Once | ORAL | Status: DC | PRN

## 2012-02-04 MED ORDER — 0.9 % SODIUM CHLORIDE (POUR BTL) OPTIME
TOPICAL | Status: DC | PRN
  Administered 2012-02-04: 1000 mL

## 2012-02-04 MED ORDER — GLYCOPYRROLATE 0.2 MG/ML IJ SOLN
INTRAMUSCULAR | Status: DC | PRN
  Administered 2012-02-04: .8 mg via INTRAVENOUS

## 2012-02-04 MED ORDER — OXYCODONE HCL 5 MG PO TABS: 5.0000 mg | ORAL_TABLET | Freq: Once | ORAL | Status: DC | PRN

## 2012-02-04 MED ORDER — LIDOCAINE-EPINEPHRINE 1 %-1:100000 IJ SOLN
INTRAMUSCULAR | Status: DC | PRN
  Administered 2012-02-04: 10 mL

## 2012-02-04 MED ORDER — ONDANSETRON HCL 4 MG PO TABS: 4.0000 mg | ORAL_TABLET | ORAL | Status: DC | PRN

## 2012-02-04 MED ORDER — PROPOFOL 10 MG/ML IV BOLUS
INTRAVENOUS | Status: DC | PRN
  Administered 2012-02-04: 200 mg via INTRAVENOUS
  Administered 2012-02-04: 50 mg via INTRAVENOUS

## 2012-02-04 MED ORDER — ROCURONIUM BROMIDE 100 MG/10ML IV SOLN
INTRAVENOUS | Status: DC | PRN
  Administered 2012-02-04: 20 mg via INTRAVENOUS
  Administered 2012-02-04: 50 mg via INTRAVENOUS

## 2012-02-04 MED ORDER — BACITRACIN 50000 UNITS IM SOLR
INTRAMUSCULAR | Status: AC
  Filled 2012-02-04: qty 1

## 2012-02-04 MED ORDER — CEFAZOLIN SODIUM-DEXTROSE 2-3 GM-% IV SOLR
2.0000 g | Freq: Three times a day (TID) | INTRAVENOUS | Status: DC
  Administered 2012-02-04 – 2012-02-05 (×2): 2 g via INTRAVENOUS
  Filled 2012-02-04 (×5): qty 50

## 2012-02-04 SURGICAL SUPPLY — 60 items
3.5mm Timesh Screw ×14 IMPLANT
BAG DECANTER FOR FLEXI CONT (MISCELLANEOUS) ×2 IMPLANT
BANDAGE GAUZE 4  KLING STR (GAUZE/BANDAGES/DRESSINGS) IMPLANT
BENZOIN TINCTURE PRP APPL 2/3 (GAUZE/BANDAGES/DRESSINGS) ×2 IMPLANT
BLADE SURG ROTATE 9660 (MISCELLANEOUS) IMPLANT
BOWL SPATULA (MISCELLANEOUS) IMPLANT
BRUSH SCRUB EZ 1% IODOPHOR (MISCELLANEOUS) IMPLANT
BUR ACORN 6.0 PRECISION (BURR) IMPLANT
BUR ADDG 1.1 (BURR) IMPLANT
BUR ROUTER D-58 CRANI (BURR) IMPLANT
CANISTER SUCTION 2500CC (MISCELLANEOUS) ×4 IMPLANT
CLIP TI MEDIUM 6 (CLIP) IMPLANT
CLOTH BEACON ORANGE TIMEOUT ST (SAFETY) ×2 IMPLANT
CONT SPEC 4OZ CLIKSEAL STRL BL (MISCELLANEOUS) ×2 IMPLANT
CORDS BIPOLAR (ELECTRODE) ×2 IMPLANT
DRAPE NEUROLOGICAL W/INCISE (DRAPES) ×2 IMPLANT
DRESSING TELFA 8X3 (GAUZE/BANDAGES/DRESSINGS) ×2 IMPLANT
DRSG OPSITE 4X5.5 SM (GAUZE/BANDAGES/DRESSINGS) ×4 IMPLANT
DURAPREP 26ML APPLICATOR (WOUND CARE) ×2 IMPLANT
ELECT CAUTERY BLADE 6.4 (BLADE) IMPLANT
ELECT NEEDLE TIP 2.8 STRL (NEEDLE) ×2 IMPLANT
ELECT REM PT RETURN 9FT ADLT (ELECTROSURGICAL) ×2
ELECTRODE REM PT RTRN 9FT ADLT (ELECTROSURGICAL) ×1 IMPLANT
GAUZE SPONGE 4X4 16PLY XRAY LF (GAUZE/BANDAGES/DRESSINGS) IMPLANT
GLOVE BIOGEL PI IND STRL 7.0 (GLOVE) ×1 IMPLANT
GLOVE BIOGEL PI INDICATOR 7.0 (GLOVE) ×1
GLOVE ECLIPSE 7.5 STRL STRAW (GLOVE) ×2 IMPLANT
GLOVE EXAM NITRILE LRG STRL (GLOVE) IMPLANT
GLOVE EXAM NITRILE MD LF STRL (GLOVE) IMPLANT
GLOVE EXAM NITRILE XL STR (GLOVE) IMPLANT
GLOVE EXAM NITRILE XS STR PU (GLOVE) IMPLANT
GLOVE SURG SS PI 6.5 STRL IVOR (GLOVE) ×4 IMPLANT
GOWN BRE IMP SLV AUR LG STRL (GOWN DISPOSABLE) ×4 IMPLANT
GOWN BRE IMP SLV AUR XL STRL (GOWN DISPOSABLE) IMPLANT
GOWN STRL REIN 2XL LVL4 (GOWN DISPOSABLE) IMPLANT
HOOK DURA (MISCELLANEOUS) IMPLANT
KIT BASIN OR (CUSTOM PROCEDURE TRAY) ×2 IMPLANT
KIT ROOM TURNOVER OR (KITS) ×2 IMPLANT
MESH C 2 WEEK (Mesh General) ×2 IMPLANT
NEEDLE HYPO 22GX1.5 SAFETY (NEEDLE) ×2 IMPLANT
NEEDLE HYPO 25X5/8 SAFETYGLIDE (NEEDLE) IMPLANT
NS IRRIG 1000ML POUR BTL (IV SOLUTION) ×2 IMPLANT
PACK CRANIOTOMY (CUSTOM PROCEDURE TRAY) ×2 IMPLANT
PAD ARMBOARD 7.5X6 YLW CONV (MISCELLANEOUS) ×6 IMPLANT
PIN MAYFIELD SKULL DISP (PIN) IMPLANT
SET CRAINOPLASTY (SET/KITS/TRAYS/PACK) IMPLANT
SPONGE GAUZE 4X4 12PLY (GAUZE/BANDAGES/DRESSINGS) IMPLANT
STAPLER SKIN PROX WIDE 3.9 (STAPLE) ×2 IMPLANT
STOCKINETTE 6  STRL (DRAPES)
STOCKINETTE 6 STRL (DRAPES) IMPLANT
STRIP CLOSURE SKIN 1/2X4 (GAUZE/BANDAGES/DRESSINGS) IMPLANT
SUT NURALON 4 0 TR CR/8 (SUTURE) IMPLANT
SUT VIC AB 2-0 CP2 18 (SUTURE) ×4 IMPLANT
SYR 20ML ECCENTRIC (SYRINGE) ×2 IMPLANT
SYR CONTROL 10ML LL (SYRINGE) ×2 IMPLANT
TOWEL OR 17X24 6PK STRL BLUE (TOWEL DISPOSABLE) ×2 IMPLANT
TOWEL OR 17X26 10 PK STRL BLUE (TOWEL DISPOSABLE) ×2 IMPLANT
TRAY FOLEY CATH 14FRSI W/METER (CATHETERS) IMPLANT
UNDERPAD 30X30 INCONTINENT (UNDERPADS AND DIAPERS) IMPLANT
WATER STERILE IRR 1000ML POUR (IV SOLUTION) ×2 IMPLANT

## 2012-02-04 NOTE — Transfer of Care (Signed)
Immediate Anesthesia Transfer of Care Note  Patient: Don Palmer  Procedure(s) Performed: Procedure(s) (LRB) with comments: CRANIOTOMY BONE FLAP/PROSTHETIC PLATE (N/A)  Patient Location: PACU  Anesthesia Type: General  Level of Consciousness: awake, alert  and patient cooperative  Airway & Oxygen Therapy: Patient Spontanous Breathing and Patient connected to face mask oxygen  Post-op Assessment: Report given to PACU RN, Post -op Vital signs reviewed and stable and Patient moving all extremities  Post vital signs: Reviewed and stable  Complications: No apparent anesthesia complications

## 2012-02-04 NOTE — Preoperative (Signed)
Beta Blockers   Reason not to administer Beta Blockers:Not Applicable 

## 2012-02-04 NOTE — Anesthesia Preprocedure Evaluation (Addendum)
Anesthesia Evaluation  Patient identified by MRN, date of birth, ID band Patient awake    Reviewed: Allergy & Precautions, H&P , NPO status , Patient's Chart, lab work & pertinent test results, reviewed documented beta blocker date and time   Airway Mallampati: II TM Distance: >3 FB Neck ROM: full    Dental  (+) Teeth Intact and Dental Advisory Given   Pulmonary neg pulmonary ROS,  breath sounds clear to auscultation        Cardiovascular negative cardio ROS  Rhythm:regular     Neuro/Psych  Headaches, negative psych ROS   GI/Hepatic negative GI ROS, Neg liver ROS,   Endo/Other  negative endocrine ROS  Renal/GU negative Renal ROS  negative genitourinary   Musculoskeletal   Abdominal   Peds  Hematology negative hematology ROS (+)   Anesthesia Other Findings See surgeon's H&P   Reproductive/Obstetrics negative OB ROS                          Anesthesia Physical Anesthesia Plan  ASA: II  Anesthesia Plan: General   Post-op Pain Management:    Induction: Intravenous  Airway Management Planned: Oral ETT  Additional Equipment:   Intra-op Plan:   Post-operative Plan: Extubation in OR  Informed Consent: I have reviewed the patients History and Physical, chart, labs and discussed the procedure including the risks, benefits and alternatives for the proposed anesthesia with the patient or authorized representative who has indicated his/her understanding and acceptance.   Dental Advisory Given  Plan Discussed with: CRNA and Surgeon  Anesthesia Plan Comments:         Anesthesia Quick Evaluation

## 2012-02-04 NOTE — Interval H&P Note (Signed)
History and Physical Interval Note:  02/04/2012 9:54 AM  Don Palmer  has presented today for surgery, with the diagnosis of Intracranial abcess, Subdural hemorrhage  The various methods of treatment have been discussed with the patient and family. After consideration of risks, benefits and other options for treatment, the patient has consented to  Procedure(s) (LRB) with comments: CRANIOTOMY BONE FLAP/PROSTHETIC PLATE (N/A) as a surgical intervention .  The patient's history has been reviewed, patient examined, no change in status, stable for surgery.  I have reviewed the patient's chart and labs.  Questions were answered to the patient's satisfaction.     Tawfiq Favila R

## 2012-02-04 NOTE — Anesthesia Postprocedure Evaluation (Signed)
Anesthesia Post Note  Patient: Don Palmer  Procedure(s) Performed: Procedure(s) (LRB): CRANIOTOMY BONE FLAP/PROSTHETIC PLATE (Left)  Anesthesia type: general  Patient location: PACU  Post pain: Pain level controlled  Post assessment: Patient's Cardiovascular Status Stable  Last Vitals:  Filed Vitals:   02/04/12 1245  BP: 141/64  Pulse:   Temp: 36.6 C  Resp:     Post vital signs: Reviewed and stable  Level of consciousness: sedated  Complications: No apparent anesthesia complications

## 2012-02-04 NOTE — Op Note (Signed)
02/04/2012  12:07 PM  PATIENT:  Don Palmer  41 y.o. male  PRE-OPERATIVE DIAGNOSIS:  Cranial defect  POST-OPERATIVE DIAGNOSIS:same  PROCEDURE:  Procedure(s): CRANIOplasty with PROSTHETIC PLATE  SURGEON:  Surgeon(s): Clydene Fake, MD   ANESTHESIA:   general  EBL:  Total I/O In: 1050 [I.V.:1050] Out: 25 [Blood:25]  BLOOD ADMINISTERED:none  DRAINS: none   SPECIMEN:  No Specimen  DICTATION: Patient with cranial defect after craniotomy for subdural hematoma abscess and craniectomy done. Patient open box months it for a while skin looks good patient brought in for cranioplasty.  Patient brought in the operating room general anesthesia induced patient prepped draped sterile fashion site of previous incision and low side of the head dura was opened with 15 blade and dissection taken with a Bovie through the galea. Bony edges were final anterior posterior and dissected through the scar expose the cranial edges appeared temporalis muscle was dissected off the dura also. The [employer] titanium plate which is estimated was then placed into position and held in place with microscrews. We urine about solution we did hemostasis the temporalis fascia was sutured to the plate to hold it and placed in the galea closed with 2 Vicryl interrupted sutures skin closed with staples dressing was placed patient was woken (and transferred recovery room  PLAN OF CARE: Admit for overnight observation  PATIENT DISPOSITION:  PACU - hemodynamically stable.

## 2012-02-04 NOTE — Anesthesia Procedure Notes (Signed)
Procedure Name: Intubation Date/Time: 02/04/2012 10:40 AM Performed by: Marni Griffon Pre-anesthesia Checklist: Patient identified, Emergency Drugs available, Suction available and Patient being monitored Patient Re-evaluated:Patient Re-evaluated prior to inductionOxygen Delivery Method: Circle system utilized Preoxygenation: Pre-oxygenation with 100% oxygen Intubation Type: IV induction Ventilation: Mask ventilation without difficulty Laryngoscope Size: Mac and 4 Grade View: Grade I Tube type: Oral Tube size: 7.5 mm Number of attempts: 1 Airway Equipment and Method: Stylet Placement Confirmation: ETT inserted through vocal cords under direct vision,  positive ETCO2 and breath sounds checked- equal and bilateral Secured at: 23 (cm at teeth) cm Tube secured with: Tape Dental Injury: Teeth and Oropharynx as per pre-operative assessment

## 2012-02-04 NOTE — H&P (Signed)
  Subjective: Pt had Left craniectomy for subdural abscess - pt is clear of infectin and wishes cranioplasty .  Patient Active Problem List   Diagnosis Date Noted  . 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 Brain abscess 05/06/2011  . Subdural hematoma 05/06/2011  . Expressive aphasia 05/06/2011   Past Medical History  Diagnosis Date  . Headache     has subdural hemotoma  . No pertinent past medical history   . Subdural hematoma   . Brain abscess   . Head injury, closed, with brief LOC 2012    Past Surgical History  Procedure Date  . Shoulder open rotator cuff repair     2010  left shoulder  . Craniotomy 03/02/2011    Procedure: CRANIOTOMY HEMATOMA EVACUATION SUBDURAL;  Surgeon: Clydene Fake;  Location: [facility];  Service: Neurosurgery;  Laterality: Left;  . Craniotomy 03/31/2011    Procedure: CRANIOTOMY HEMATOMA EVACUATION SUBDURAL;  Surgeon: Clydene Fake;  Location: [facility];  Service: Neurosurgery;  Laterality: Left;  Left Craniectomy for Subdural Abcess    No prescriptions prior to admission   No Known Allergies  History  Substance Use Topics  . Smoking status: Never Smoker   . Smokeless tobacco: Never Used   Comment: Quit in 1998  . Alcohol Use: 1.2 oz/week    2 Cans of beer per week    History reviewed. No pertinent family history.  Review of Systems A comprehensive review of systems was negative.  Objective: Vital signs in last 24 hours: Temp:  [97.9 F (36.6 C)] 97.9 F (36.6 C) (10/17 0720) Pulse Rate:  [68] 68  (10/17 0720) Resp:  [20] 20  (10/17 0720) BP: (136)/(85) 136/85 mmHg (10/17 0720) SpO2:  [98 %] 98 % (10/17 0720)  AAOx3, moves all well - motor, sensation intact  Data Review Coagulation:  Lab Results  Component Value Date   INR 0.96 02/04/2012   APTT 34 02/04/2012    Assessment/Plan: Admit for cranioplasty   Watt Geiler R, MD 02/04/2012 9:51 AM

## 2012-02-04 NOTE — Plan of Care (Signed)
Problem: Consults Goal: Craniotomy Patient Education See Patient Education Module for education specifics. Outcome: Completed/Met Date Met:  02/04/12 Cranioplasty performed.

## 2012-02-04 NOTE — Transfer of Care (Signed)
Immediate Anesthesia Transfer of Care Note  Patient: Don Palmer  Procedure(s) Performed: Procedure(s) (LRB) with comments: CRANIOTOMY BONE FLAP/PROSTHETIC PLATE (Left) - Cranioplasty with Cranila mesh implant  Patient Location: PACU  Anesthesia Type: General  Level of Consciousness: awake and patient cooperative  Airway & Oxygen Therapy: Patient Spontanous Breathing and Patient connected to face mask oxygen  Post-op Assessment: Report given to PACU RN, Post -op Vital signs reviewed and stable and Patient moving all extremities  Post vital signs: Reviewed and stable  Complications: No apparent anesthesia complications

## 2012-02-05 MED ORDER — HYDROCODONE-ACETAMINOPHEN 5-325 MG PO TABS: 1.0000 | ORAL_TABLET | ORAL | Status: DC | PRN

## 2012-02-05 NOTE — Discharge Summary (Signed)
Physician Discharge Summary  Patient ID: Don Palmer MRN: [id number] DOB/AGE: [birth year] 41 y.o.  Admit date: 02/04/2012 Discharge date: 02/05/2012  Admission Diagnoses:Cranial defect   Discharge Diagnoses: Cranial defect  Active Problems:  * No active hospital problems. *    Discharged Condition: good  Hospital Course: pt admitted day of surgery, underwent procedure below - pt doing well, minimal pain, uma ambulating, eating, voiding  Consults: None  Significant Diagnostic Studies: none  Treatments: surgery: CRANIOplasty with PROSTHETIC PLATE   Discharge Exam: Blood pressure 115/72, pulse 62, temperature 98.8 F (37.1 C), temperature source Oral, resp. rate 21, height 5\' 8"  (1.727 m), weight 89.7 kg (197 lb 12 oz), SpO2 99.00%. Wound:c/d/i  Disposition: home     Medication List     As of 02/05/2012  8:11 AM    TAKE these medications         HYDROcodone-acetaminophen 5-325 MG per tablet   Commonly known as: NORCO/VICODIN   Take 1-2 tablets by mouth every 4 (four) hours as needed for pain.         Signed: Clydene Fake, MD 02/05/2012, 8:11 AM

## 2012-02-09 ENCOUNTER — Encounter (HOSPITAL_COMMUNITY): Payer: Self-pay | Admitting: Neurosurgery

## 2012-10-28 IMAGING — CT CT HEAD WO/W CM
3 of 4 series · 16 of 30 positions shown, 18 images · IV contrast (omnipaque)
Comparison: 03/31/2011.

CLINICAL DATA: Subdural abscess.  Post craniotomy.

CT HEAD WITHOUT AND WITH CONTRAST
TECHNIQUE: Contiguous axial images were obtained from the base of
the skull through the vertex without and with intravenous contrast.
Contrast: 100mL OMNIPAQUE IOHEXOL 300 MG/ML IV SOLN

[Series 2: brain · axial · 0.49mm/px · z∈[+139,+239]mm · 7 of 52 slices shown, 9 images (1 of 2)]
[im 7/52  brain]
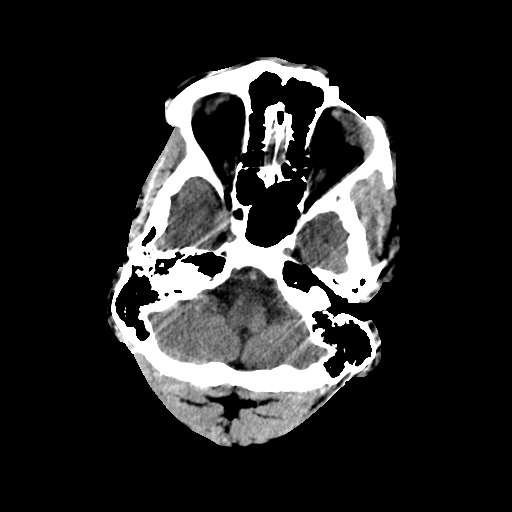
[im 7/52  bone]
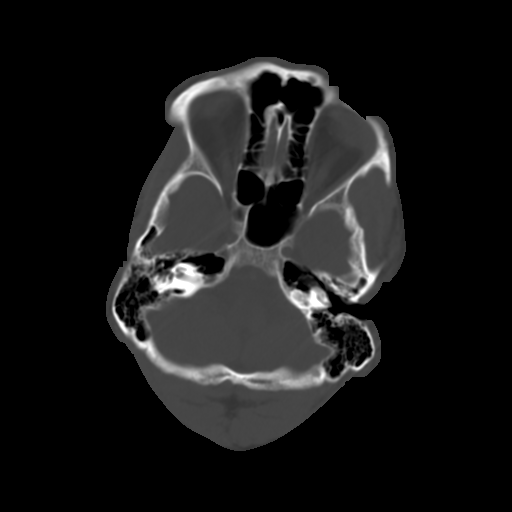
[im 13/52  brain]
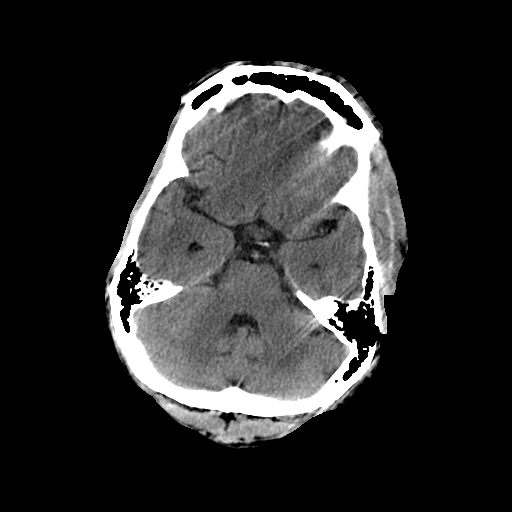
[im 20/52  brain]
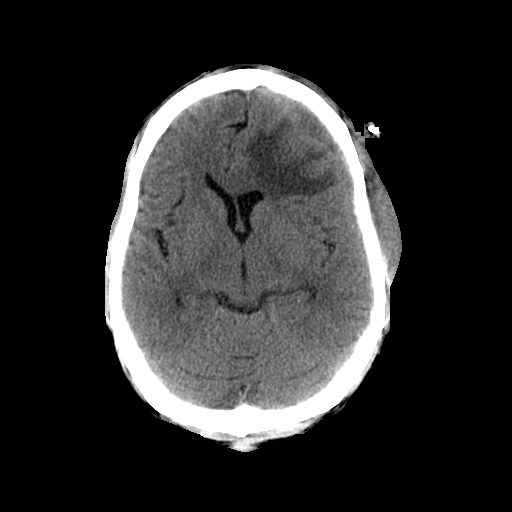
[im 26/52  brain]
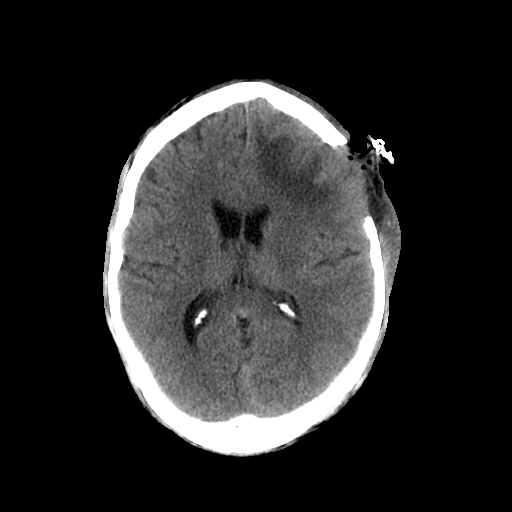
[im 32/52  brain]
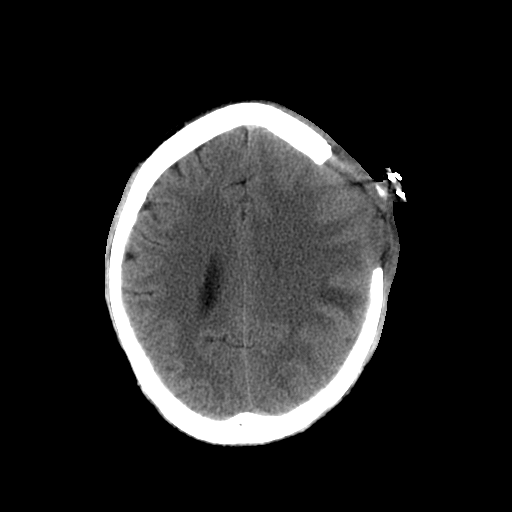
[im 32/52  bone]
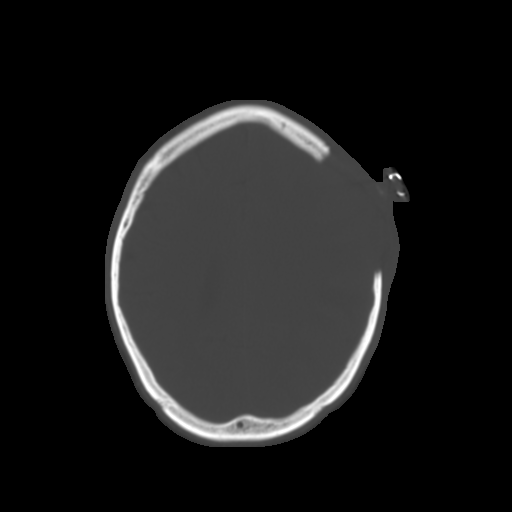
[im 39/52  brain]
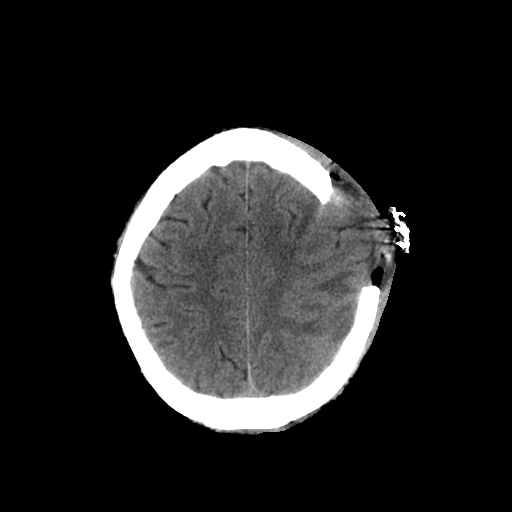
[im 45/52  brain]
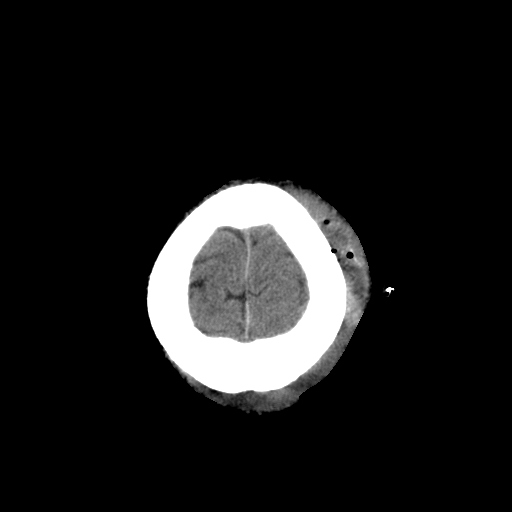

[Series 3: recon 2: brain · axial · 0.49mm/px · z∈[+139,+239]mm · 7 of 52 slices shown]
[im 7/52  brain]
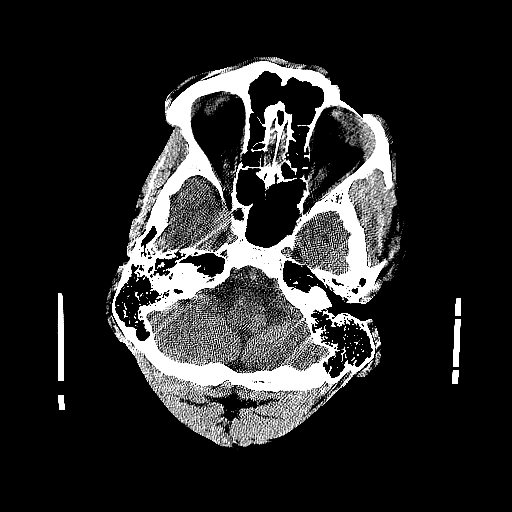
[im 13/52  brain]
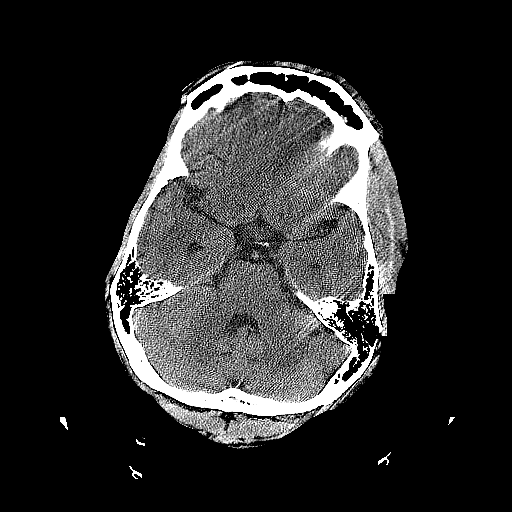
[im 20/52  brain]
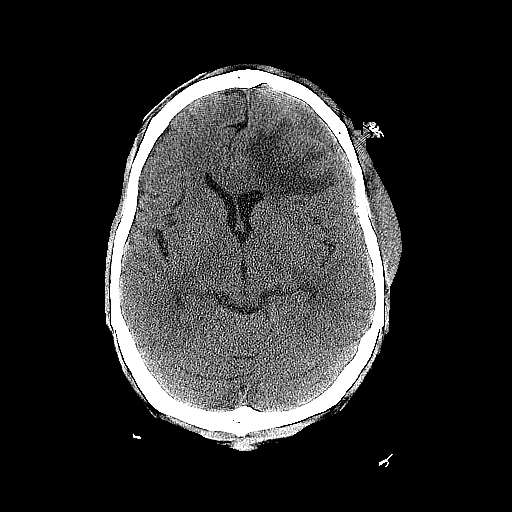
[im 26/52  brain]
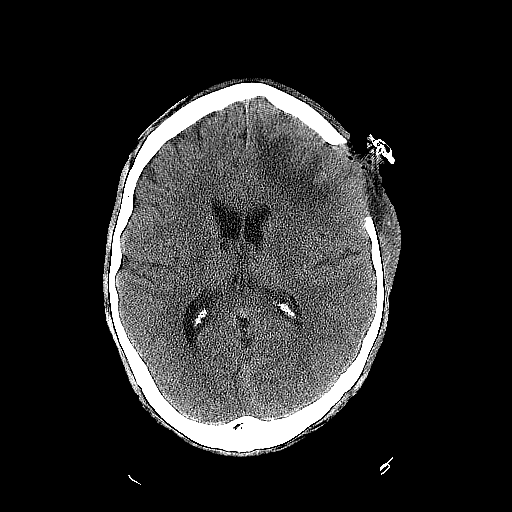
[im 32/52  brain]
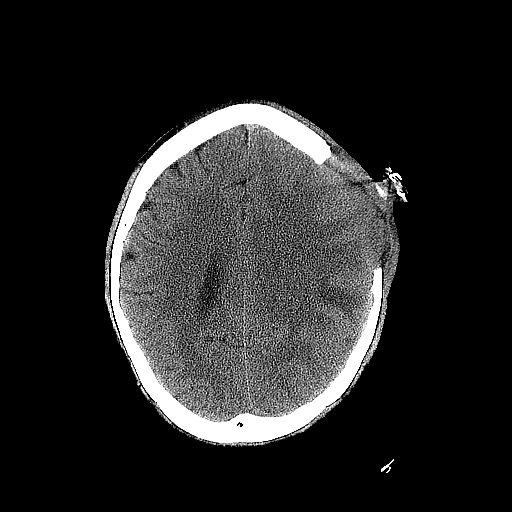
[im 39/52  brain]
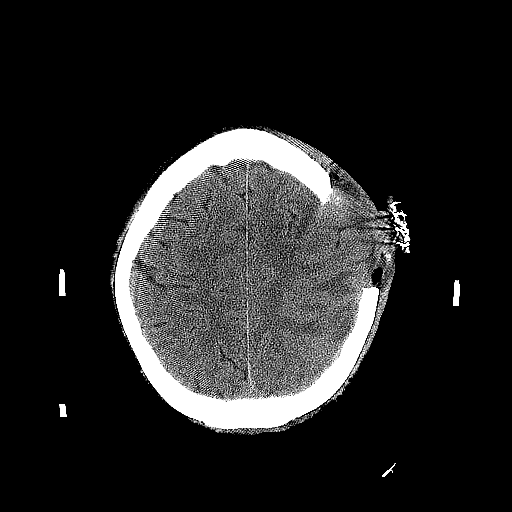
[im 45/52  brain]
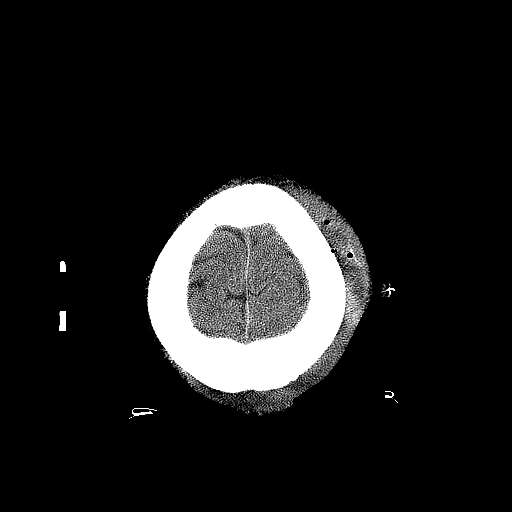

[Series 4: brain · axial · 0.47mm/px · z∈[+142,+172]mm · 2 of 32 slices shown (2 of 2)]
[im 7/32  brain]
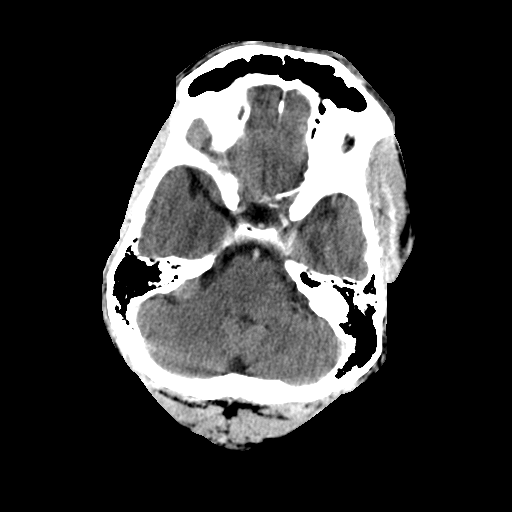
[im 13/32  brain]
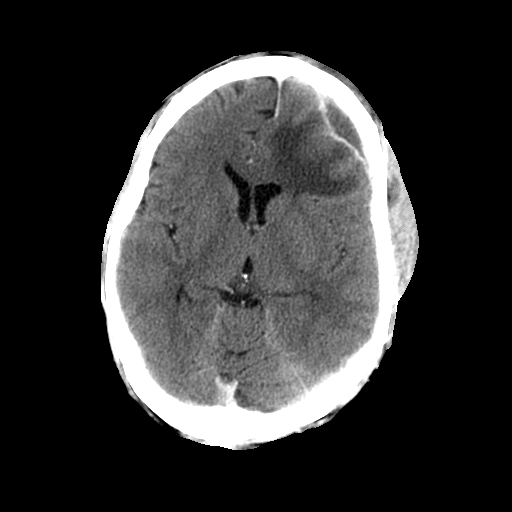

[16 of 30 positions shown; findings below may reference images not displayed]

FINDINGS: Recent left frontal craniectomy for drainage of left-
sided subdural empyema.  In the left frontal region, there is a
complex extra axial rim enhancing fluid collection with maximal
transverse dimension of 4.8 x 1.3 cm.  In the superior left
parietal region, extra-axial complex fluid collection with
peripheral enhancement measures 3.5 x 1.3 cm.  These findings are
suggestive of recurrent/residual subdural empyema.  Enhancement of
the adjacent sulci most notable left parietal lobe (cerebritis is a
possibility).  Vasogenic edema at both of these levels.

Brain extends through the craniotomy site.  Adjacent to the
craniotomy site there is a complex gas and fluid containing
collection with peripheral enhancement measuring 5.1 x 0.9 cm.
This may contain surgical packing material.  Separate from this is
a superiorly located fluid and gas containing collection measuring
4.9 x 2 cm. Although this may represent simple postoperative
changes, infection is not excluded.

Diffuse edema of the left temporalis muscle.

Left frontal collection and vasogenic edema cause mass effect upon
the left lateral ventricle with midline shift to the right by
mm.

The major dural sinuses remain patent.

On the precontrast motion degraded images, no obvious hemorrhage is
noted.  The mild edema extending into the left lateral basal
ganglia and left internal capsule is made the related to vasogenic
edema extending into this region.  Infarct not entirely excluded.
IMPRESSION: Postoperative changes with residual/recurrent left hemispheric
subdural empyemas and local mass effect suspected as discussed
above.

## 2013-03-29 ENCOUNTER — Emergency Department (HOSPITAL_COMMUNITY)
Admission: EM | Admit: 2013-03-29 | Discharge: 2013-03-29 | Disposition: A | Discharge status: Home / Self Care | Payer: Worker's Compensation | Attending: Emergency Medicine | Admitting: Emergency Medicine

## 2013-03-29 ENCOUNTER — Encounter (HOSPITAL_COMMUNITY): Payer: Self-pay | Admitting: Emergency Medicine

## 2013-03-29 DIAGNOSIS — H579 Unspecified disorder of eye and adnexa: Secondary | ICD-10-CM | POA: Insufficient documentation

## 2013-03-29 DIAGNOSIS — Z87828 Personal history of other (healed) physical injury and trauma: Secondary | ICD-10-CM | POA: Insufficient documentation

## 2013-03-29 DIAGNOSIS — H18891 Other specified disorders of cornea, right eye: Secondary | ICD-10-CM

## 2013-03-29 MED ORDER — SULFACETAMIDE SODIUM 10 % OP SOLN: 2.0000 [drp] | OPHTHALMIC | Status: DC

## 2013-03-29 MED ORDER — HYDROCODONE-ACETAMINOPHEN 5-325 MG PO TABS: 1.0000 | ORAL_TABLET | Freq: Four times a day (QID) | ORAL | Status: DC | PRN

## 2013-03-29 MED ORDER — FLUORESCEIN SODIUM 1 MG OP STRP
1.0000 | ORAL_STRIP | Freq: Once | OPHTHALMIC | Status: AC
  Administered 2013-03-29: 1 via OPHTHALMIC
  Filled 2013-03-29: qty 1

## 2013-03-29 MED ORDER — TETRACAINE HCL 0.5 % OP SOLN
2.0000 [drp] | Freq: Once | OPHTHALMIC | Status: AC
  Administered 2013-03-29: 2 [drp] via OPHTHALMIC
  Filled 2013-03-29: qty 2

## 2013-03-29 NOTE — ED Provider Notes (Signed)
CSN: [id number]     Arrival date & time 03/29/13  0535 History   First MD Initiated Contact with Patient 03/29/13 [phone]     Chief Complaint  Patient presents with  . Eye Injury   (Consider location/radiation/quality/duration/timing/severity/associated sxs/prior Treatment) HPI Patient presents to the emergency department with right eye discomfort that started yesterday while at work.  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  The patient, states, that light makes his discomfort worse.  Patient, states he did not take any medications prior to arrival.  Patient denies headache, nausea, vomiting, dizziness, or syncope Past Medical History  Diagnosis Date  . Headache(784.0)     has subdural hemotoma  . No pertinent past medical history   . Subdural hematoma   . Brain abscess   . Head injury, closed, with brief LOC 2012   Past Surgical History  Procedure Laterality Date  . Shoulder open rotator cuff repair      2010  left shoulder  . Craniotomy  03/02/2011    Procedure: CRANIOTOMY HEMATOMA EVACUATION SUBDURAL;  Surgeon: Clydene Fake;  Location: [facility];  Service: Neurosurgery;  Laterality: Left;  . Craniotomy  03/31/2011    Procedure: CRANIOTOMY HEMATOMA EVACUATION SUBDURAL;  Surgeon: Clydene Fake;  Location: [facility];  Service: Neurosurgery;  Laterality: Left;  Left Craniectomy for Subdural Abcess  . Craniotomy  02/04/2012    Procedure: CRANIOTOMY BONE FLAP/PROSTHETIC PLATE;  Surgeon: Clydene Fake, MD;  Location: [facility];  Service: Neurosurgery;  Laterality: Left;  Cranioplasty with Cranila mesh implant   No family history on file. History  Substance Use Topics  . Smoking status: Never Smoker   . Smokeless tobacco: Never Used     Comment: Quit in 1998  . Alcohol Use: 1.2 oz/week    2 Cans of beer per week    Review of Systems All other systems negative except as  documented in the HPI. All pertinent positives and negatives as reviewed in the HPI. Allergies  Review of patient's allergies indicates no known allergies.  Home Medications  No current outpatient prescriptions on file. BP 136/84  Pulse 84  Temp(Src) 99.1 F (37.3 C) (Oral)  Resp 18  Ht 5\' 8"  (1.727 m)  Wt 183 lb (83.008 kg)  BMI 27.83 kg/m2  SpO2 99% Physical Exam  Constitutional: He appears well-developed and well-nourished. No distress.  HENT:  Head: Normocephalic and atraumatic.  Mouth/Throat: Oropharynx is clear and moist.  Eyes: EOM and lids are normal. Pupils are equal, round, and reactive to light. Lids are everted and swept, no foreign bodies found. No foreign body present in the right eye. Right conjunctiva is injected.  Fundoscopic exam:      The right eye shows no exudate.  Slit lamp exam:      The right eye shows corneal flare. The right eye shows no corneal abrasion, no corneal ulcer and no foreign body.    ED Course  Procedures (including critical care time) I spoke with Dr. Allyne Gee from ophthalmology, who will follow up with patient in his office.  Patient does not have any foreign body noted on exam.  Carlyle Dolly, PA-C 03/29/13 [phone]

## 2013-03-29 NOTE — ED Provider Notes (Signed)
Medical screening examination/treatment/procedure(s) were performed by non-physician practitioner and as supervising physician I was immediately available for consultation/collaboration.    Olivia Mackie, MD 03/29/13 [phone]

## 2013-03-29 NOTE — ED Notes (Signed)
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 Pt reports severe eye pain ever since the injury occurred. Pt states he was wearing safety glasses but that they are small and do not completely block his eyes. Pt states light increases his right eye. Pt's right eye is irritated and is blood-shot red. Pt states he used the eye flush at least five times, pt states it increased his pain and did not relieve the underlying issue. Pt is A&O X4.

## 2013-03-29 NOTE — ED Notes (Signed)
Pt's eye flushed with 40 cc of normal saline

## 2018-05-26 ENCOUNTER — Emergency Department (HOSPITAL_COMMUNITY): Payer: Worker's Compensation

## 2018-05-26 ENCOUNTER — Emergency Department (HOSPITAL_COMMUNITY)
Admission: EM | Admit: 2018-05-26 | Discharge: 2018-05-26 | Disposition: A | Discharge status: Home / Self Care | Payer: Worker's Compensation | Attending: Emergency Medicine | Admitting: Emergency Medicine

## 2018-05-26 ENCOUNTER — Encounter (HOSPITAL_COMMUNITY): Payer: Self-pay

## 2018-05-26 DIAGNOSIS — Y939 Activity, unspecified: Secondary | ICD-10-CM | POA: Diagnosis not present

## 2018-05-26 DIAGNOSIS — M7022 Olecranon bursitis, left elbow: Secondary | ICD-10-CM | POA: Diagnosis not present

## 2018-05-26 DIAGNOSIS — S42402A Unspecified fracture of lower end of left humerus, initial encounter for closed fracture: Secondary | ICD-10-CM

## 2018-05-26 NOTE — ED Triage Notes (Signed)
Pt states he injured his left elbow at work, some swelling noted. Pt denies pain unless he hits the elbow.

## 2018-05-26 NOTE — ED Notes (Signed)
Pt verbalized understanding of discharge paperwork and follow-up care.  °

## 2018-05-26 NOTE — Discharge Instructions (Signed)
The xray showed a small fracture of the bone spur on your elbow. You may wear the sling for comfort. You may also take nsaids like ibuprofen to help relief the pain and swelling to the area. Please call the orthopedic office to schedule and appointment for follow up.  Please return to the emergency department for any new or worsening symptoms.

## 2018-05-26 NOTE — ED Provider Notes (Signed)
[facility] EMERGENCY DEPARTMENT Provider Note   CSN: [id number] Arrival date & time: 05/26/18  1333     History   Chief Complaint Chief Complaint  Patient presents with  . Elbow Pain    HPI Don Palmer is a 48 y.o. male.  HPI   Patient is a 49 year old male with a history of subdural hematoma, brain abscess, who presents the emergency department today for evaluation of left elbow pain.  Patient states he injured his left elbow at work several days ago after hitting it on something.  He endorses very mild pain that is worse with palpation.  Today he noticed that he had some swelling to the elbow so he came to be evaluated.  He denies any fevers or chills.  He has full range of motion of the left upper extremity.  Normal sensation.  Past Medical History:  Diagnosis Date  . Brain abscess   . Head injury, closed, with brief LOC (HCC) 2012  . Headache(784.0)    has subdural hemotoma  . No pertinent past medical history   . Subdural hematoma [facility])     Patient Active Problem List   Diagnosis Date Noted  . 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 Brain abscess 05/06/2011  . Subdural hematoma (HCC) 05/06/2011  . Expressive aphasia 05/06/2011    Past Surgical History:  Procedure Laterality Date  . CRANIOTOMY  03/02/2011   Procedure: CRANIOTOMY HEMATOMA EVACUATION SUBDURAL;  Surgeon: Clydene Fake;  Location: [facility];  Service: Neurosurgery;  Laterality: Left;  . CRANIOTOMY  03/31/2011   Procedure: CRANIOTOMY HEMATOMA EVACUATION SUBDURAL;  Surgeon: Clydene Fake;  Location: [facility];  Service: Neurosurgery;  Laterality: Left;  Left Craniectomy for Subdural Abcess  . CRANIOTOMY  02/04/2012   Procedure: CRANIOTOMY BONE FLAP/PROSTHETIC PLATE;  Surgeon: Clydene Fake, MD;  Location: [facility];  Service: Neurosurgery;  Laterality: Left;  Cranioplasty with Cranila mesh implant  . SHOULDER OPEN ROTATOR CUFF REPAIR     2010  left shoulder         Home Medications    Prior to Admission medications   Medication Sig Start Date End Date Taking? Authorizing Provider  HYDROcodone-acetaminophen (NORCO/VICODIN) 5-325 MG per tablet Take 1 tablet by mouth every 6 (six) hours as needed for moderate pain. 03/29/13   Lawyer, Cristal Deer, PA-C  sulfacetamide (BLEPH-10) 10 % ophthalmic solution Place 2 drops into the right eye every 4 (four) hours. 03/29/13   Charlestine Night, PA-C    Family History History reviewed. No pertinent family history.  Social History Social History   Tobacco Use  . Smoking status: Never Smoker  . Smokeless tobacco: Never Used  . Tobacco comment: Quit in 1998  Substance Use Topics  . Alcohol use: Yes    Alcohol/week: 2.0 standard drinks    Types: 2 Cans of beer per week  . Drug use: No     Allergies   Patient has no known allergies.   Review of Systems Review of Systems  Constitutional: Negative for fever.  Musculoskeletal:       Elbow pain     Physical Exam Updated Vital Signs BP (!) 142/84 (BP Location: Right Arm)   Pulse 73   Temp 98.6 F (37 C) (Oral)   Resp 16   SpO2 100%   Physical Exam Constitutional:      General: He is not in acute distress.    Appearance: He is well-developed.  Eyes:  Conjunctiva/sclera: Conjunctivae normal.  Cardiovascular:     Rate and Rhythm: Normal rate and regular rhythm.  Pulmonary:     Effort: Pulmonary effort is normal.     Breath sounds: Normal breath sounds.  Musculoskeletal:     Comments: Very mild ttp over the left olecranon bursae with overlying swelling to the olecranon bursa. No significant pain with flexion/extension. Normal sensation. No erythema or significant warmth.  Skin:    General: Skin is warm and dry.  Neurological:     Mental Status: He is alert and oriented to person, place, and time.      ED Treatments / Results  Labs (all labs ordered are listed, but only abnormal results are displayed) Labs Reviewed -  No data to display  EKG None  Radiology Dg Elbow Complete Left  Result Date: 05/26/2018 CLINICAL DATA:  Hit Posterior Elbow No Metal Table some Swelling And pain EXAM: LEFT ELBOW - COMPLETE 3+ VIEW COMPARISON:  None. FINDINGS: There is an olecranon enthesophyte at the insertion of the triceps tendon. Apparent fracture of the the enthesophyte, nondisplaced. This may be chronic or acute. There is overlying soft tissue swelling. No other evidence of a fracture. The elbow joint is normally spaced and aligned. No arthropathic changes. No joint effusion. No other soft tissue abnormality. IMPRESSION: 1. Evidence suggesting an acute fracture of the enthesophyte at the posterior margin of the olecranon at the triceps tendon insertion, nondisplaced, with overlying soft tissue swelling. 2. No other fracture.  No other abnormalities. Electronically Signed   By: Amie Portland M.D.   On: 05/26/2018 14:53    Procedures Procedures (including critical care time) SPLINT APPLICATION Date/Time: 3:22 PM Authorized by: Karrie Meres Consent: Verbal consent obtained. Risks and benefits: risks, benefits and alternatives were discussed Consent given by: patient Splint applied by: orthopedic technician Location details: LUE Splint type: splint Post-procedure: The splinted body part was neurovascularly unchanged following the procedure. Patient tolerance: Patient tolerated the procedure well with no immediate complications.  Medications Ordered in ED Medications - No data to display   Initial Impression / Assessment and Plan / ED Course  I have reviewed the triage vital signs and the nursing notes.  Pertinent labs & imaging results that were available during my care of the patient were reviewed by me and considered in my medical decision making (see chart for details).     Final Clinical Impressions(s) / ED Diagnoses   Final diagnoses:  Olecranon bursitis of left elbow  Closed fracture of left elbow,  initial encounter   Patient presented emergency department today for evaluation of left elbow pain that began after hitting his elbow on an object at work.  Does have some mild tenderness over the olecranon with swelling to the olecranon bursa.  No signs of septic bursitis.  Does have good range of motion of the elbow.  X-ray of the left elbow shows evidence suggesting an acute fracture of the enthesophyte at the posterior margin of the olecranon at the triceps tendon insertion, nondisplaced, with overlying soft tissue swelling.  Will give sling for comfort, have patient avoid heavy lifting and follow-up with orthopedics.  Advised NSAIDs to reduce inflammation and pain.  Advised to return the ER for new or worsening symptoms.  He voices understanding the plan and reasons to return the ED.  All questions answered.   ED Discharge Orders    None       Rayne Du 05/26/18 1522    Linwood Dibbles, MD  05/26/18 1720  

## 2018-10-17 ENCOUNTER — Encounter (HOSPITAL_COMMUNITY): Payer: Self-pay

## 2018-10-17 ENCOUNTER — Ambulatory Visit (HOSPITAL_COMMUNITY)
Admission: EM | Admit: 2018-10-17 | Discharge: 2018-10-17 | Disposition: A | Discharge status: Home / Self Care | Payer: BLUE CROSS/BLUE SHIELD | Attending: Family Medicine | Admitting: Family Medicine

## 2018-10-17 ENCOUNTER — Other Ambulatory Visit: Payer: Self-pay

## 2018-10-17 ENCOUNTER — Ambulatory Visit (INDEPENDENT_AMBULATORY_CARE_PROVIDER_SITE_OTHER): Payer: BLUE CROSS/BLUE SHIELD

## 2018-10-17 DIAGNOSIS — M25561 Pain in right knee: Secondary | ICD-10-CM

## 2018-10-17 MED ORDER — IBUPROFEN 800 MG PO TABS: 800.0000 mg | ORAL_TABLET | Freq: Three times a day (TID) | ORAL | 0 refills | Status: DC

## 2018-10-17 NOTE — ED Triage Notes (Signed)
Pt presents with right knee pain after injury during a sport.

## 2018-10-17 NOTE — Discharge Instructions (Signed)
Take ibuprofen 3 times a day with food Call your orthopedist if not improved in a week or 2.  I believe you need additional evaluation (likely MRI)

## 2018-10-17 NOTE — ED Provider Notes (Signed)
[facility]    CSN: [id number] Arrival date & time: 10/17/18  1351     History   Chief Complaint Chief Complaint  Patient presents with  . Knee Pain    HPI Don Palmer is a 48 y.o. male.   HPI  Patient injured his right knee about 2 months ago.  Playing with the grandson in the backyard.  They are playing soccer.  He swung his right leg from an abducted position across in front of him to kick the ball with his medial foot, felt pain in his medial knee.  Has had pain there ever since.  At times it feels like his cannot buckle.  It hurts when he stands on it for a long period of time.  No swelling or warmth.  No prior injury to the knee.  He is currently off work with a left elbow surgery and would like to have his knee looked at while he recovers.  Fall.  No trauma.  He does stand on his feet a lot at work and finds the knee painful towards the end of the workday.  No pain at rest  Past Medical History:  Diagnosis Date  . Brain abscess   . Head injury, closed, with brief LOC (HCC) 2012  . Headache(784.0)    has subdural hemotoma  . No pertinent past medical history   . Subdural hematoma [facility])     Patient Active Problem List   Diagnosis Date Noted  . 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 Brain abscess 05/06/2011  . Subdural hematoma (HCC) 05/06/2011  . Expressive aphasia 05/06/2011    Past Surgical History:  Procedure Laterality Date  . CRANIOTOMY  03/02/2011   Procedure: CRANIOTOMY HEMATOMA EVACUATION SUBDURAL;  Surgeon: Clydene FakeJames R Hirsch;  Location: [facility];  Service: Neurosurgery;  Laterality: Left;  . CRANIOTOMY  03/31/2011   Procedure: CRANIOTOMY HEMATOMA EVACUATION SUBDURAL;  Surgeon: Clydene FakeJames R Hirsch;  Location: [facility];  Service: Neurosurgery;  Laterality: Left;  Left Craniectomy for Subdural Abcess  . CRANIOTOMY  02/04/2012   Procedure: CRANIOTOMY BONE FLAP/PROSTHETIC PLATE;  Surgeon: Clydene FakeJames R Hirsch, MD;  Location: [facility];   Service: Neurosurgery;  Laterality: Left;  Cranioplasty with Cranila mesh implant  . SHOULDER OPEN ROTATOR CUFF REPAIR     2010  left shoulder       Home Medications    Prior to Admission medications   Medication Sig Start Date End Date Taking? Authorizing Provider  ibuprofen (ADVIL) 800 MG tablet Take 1 tablet (800 mg total) by mouth 3 (three) times daily. 10/17/18   Eustace MooreNelson, Yvonne Sue, MD    Family History Family History  Family history unknown: Yes    Social History Social History   Tobacco Use  . Smoking status: Never Smoker  . Smokeless tobacco: Never Used  . Tobacco comment: Quit in 1998  Substance Use Topics  . Alcohol use: Yes    Alcohol/week: 2.0 standard drinks    Types: 2 Cans of beer per week  . Drug use: No     Allergies   Patient has no known allergies.   Review of Systems Review of Systems  Constitutional: Negative for chills and fever.  HENT: Negative for ear pain and sore throat.   Eyes: Negative for pain and visual disturbance.  Respiratory: Negative for cough and shortness of breath.   Cardiovascular: Negative for chest pain and palpitations.  Gastrointestinal: Negative for abdominal pain and vomiting.  Genitourinary: Negative  for dysuria and hematuria.  Musculoskeletal: Positive for arthralgias and gait problem. Negative for back pain.  Skin: Negative for color change and rash.  Neurological: Negative for seizures and syncope.  All other systems reviewed and are negative.    Physical Exam Triage Vital Signs ED Triage Vitals  Enc Vitals Group     BP 10/17/18 1423 130/86     Pulse Rate 10/17/18 1423 72     Resp 10/17/18 1423 16     Temp 10/17/18 1423 99 F (37.2 C)     Temp Source 10/17/18 1423 Oral     SpO2 10/17/18 1423 95 %     Weight --      Height --      Head Circumference --      Peak Flow --      Pain Score 10/17/18 1425 6     Pain Loc --      Pain Edu? --      Excl. in GC? --    No data found.  Updated Vital Signs  BP 130/86 (BP Location: Left Arm)   Pulse 72   Temp 99 F (37.2 C) (Oral)   Resp 16   SpO2 95%   Visual Acuity Right Eye Distance:   Left Eye Distance:   Bilateral Distance:    Right Eye Near:   Left Eye Near:    Bilateral Near:     Physical Exam Constitutional:      General: He is not in acute distress.    Appearance: He is well-developed.  HENT:     Head: Normocephalic and atraumatic.  Eyes:     Conjunctiva/sclera: Conjunctivae normal.     Pupils: Pupils are equal, round, and reactive to light.  Neck:     Musculoskeletal: Normal range of motion.  Cardiovascular:     Rate and Rhythm: Normal rate and regular rhythm.  Pulmonary:     Effort: Pulmonary effort is normal. No respiratory distress.     Breath sounds: Normal breath sounds.  Abdominal:     General: There is no distension.     Palpations: Abdomen is soft.  Musculoskeletal: Normal range of motion.     Comments: Right knee is examined.  Full range of motion.  No effusion.  No instability.  There is tenderness posterior portion of the medial joint line and over the insertion of the MCL.  Pain with stress of MCL but no instability.  No drawer sign.  Skin:    General: Skin is warm and dry.  Neurological:     Mental Status: He is alert.      UC Treatments / Results  Labs (all labs ordered are listed, but only abnormal results are displayed) Labs Reviewed - No data to display  EKG None  Radiology Dg Knee Complete 4 Views Right  Result Date: 10/17/2018 CLINICAL DATA:  Medial knee pain EXAM: RIGHT KNEE - COMPLETE 4+ VIEW COMPARISON:  None. FINDINGS: No evidence of fracture, dislocation, or joint effusion. No evidence of arthropathy or other focal bone abnormality. Soft tissues are unremarkable. IMPRESSION: Negative. Electronically Signed   By: Elige KoHetal  Patel   On: 10/17/2018 15:13    Procedures Procedures (including critical care time)  Medications Ordered in UC Medications - No data to display  Initial  Impression / Assessment and Plan / UC Course  I have reviewed the triage vital signs and the nursing notes.  Pertinent labs & imaging results that were available during my care of the patient were  reviewed by me and considered in my medical decision making (see chart for details).     Explained to the patient that knee pain persisting for months with buckling and tenderness around the joint line is likely a soft tissue injury (cartilage and ligament).  X-rays were negative, bone structure is normal.  Follow-up with his orthopedic Final Clinical Impressions(s) / UC Diagnoses   Final diagnoses:  Acute pain of right knee     Discharge Instructions     Take ibuprofen 3 times a day with food Call your orthopedist if not improved in a week or 2.  I believe you need additional evaluation (likely MRI)    ED Prescriptions    Medication Sig Dispense Auth. Provider   ibuprofen (ADVIL) 800 MG tablet Take 1 tablet (800 mg total) by mouth 3 (three) times daily. 21 tablet Raylene Everts, MD     Controlled Substance Prescriptions [place] Controlled Substance Registry consulted? Not Applicable   Raylene Everts, MD 10/17/18 1536

## 2018-12-05 ENCOUNTER — Encounter (HOSPITAL_COMMUNITY): Payer: Self-pay | Admitting: Emergency Medicine

## 2018-12-05 ENCOUNTER — Emergency Department (HOSPITAL_COMMUNITY)
Admission: EM | Admit: 2018-12-05 | Discharge: 2018-12-05 | Disposition: A | Discharge status: Home / Self Care | Payer: BC Managed Care – PPO | Attending: Emergency Medicine | Admitting: Emergency Medicine

## 2018-12-05 ENCOUNTER — Other Ambulatory Visit: Payer: Self-pay

## 2018-12-05 DIAGNOSIS — M542 Cervicalgia: Secondary | ICD-10-CM | POA: Insufficient documentation

## 2018-12-05 DIAGNOSIS — Y999 Unspecified external cause status: Secondary | ICD-10-CM | POA: Insufficient documentation

## 2018-12-05 DIAGNOSIS — M545 Low back pain, unspecified: Secondary | ICD-10-CM

## 2018-12-05 DIAGNOSIS — M25511 Pain in right shoulder: Secondary | ICD-10-CM | POA: Insufficient documentation

## 2018-12-05 DIAGNOSIS — Y92410 Unspecified street and highway as the place of occurrence of the external cause: Secondary | ICD-10-CM | POA: Diagnosis not present

## 2018-12-05 DIAGNOSIS — Y9389 Activity, other specified: Secondary | ICD-10-CM | POA: Insufficient documentation

## 2018-12-05 DIAGNOSIS — Z87891 Personal history of nicotine dependence: Secondary | ICD-10-CM | POA: Insufficient documentation

## 2018-12-05 MED ORDER — METHOCARBAMOL 500 MG PO TABS: 500.0000 mg | ORAL_TABLET | Freq: Every evening | ORAL | 0 refills | Status: DC | PRN
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
## 2018-12-05 NOTE — Discharge Instructions (Signed)
Take muscle relaxer at bedtime to help you sleep. This medicine makes you drowsy so do not take before driving or work Use a heating pad for sore muscles - use for 20 minutes several times a day Return for worsening symptoms

## 2019-07-13 ENCOUNTER — Ambulatory Visit: Payer: BC Managed Care – PPO | Attending: Internal Medicine

## 2019-07-13 DIAGNOSIS — Z23 Encounter for immunization: Secondary | ICD-10-CM

## 2019-07-13 NOTE — Progress Notes (Signed)
   Covid-19 Vaccination Clinic  Name:  Don Palmer    MRN: [id number] DOB: 07-10-1970  07/13/2019  Don Palmer was observed post Covid-19 immunization for 15 minutes without incident. He was provided with Vaccine Information Sheet and instruction to access the V-Safe system.   Don Palmer was instructed to call 911 with any severe reactions post vaccine: Marland Kitchen Difficulty breathing  . Swelling of face and throat  . A fast heartbeat  . A bad rash all over body  . Dizziness and weakness   Immunizations Administered    Name Date Dose VIS Date Route   Pfizer COVID-19 Vaccine 07/13/2019 12:24 PM 0.3 mL 03/31/2019 Intramuscular   Manufacturer: [employer], [employer]   Lot: [id number]   NDC: [address]-3

## 2019-08-07 ENCOUNTER — Ambulatory Visit: Payer: BC Managed Care – PPO | Attending: Internal Medicine

## 2019-08-07 DIAGNOSIS — Z23 Encounter for immunization: Secondary | ICD-10-CM

## 2019-08-07 NOTE — Progress Notes (Signed)
   Covid-19 Vaccination Clinic  Name:  Don Palmer    MRN: [id number] DOB: 11-Aug-1970  08/07/2019  Mr. Don Palmer was observed post Covid-19 immunization for 15 minutes without incident. He was provided with Vaccine Information Sheet and instruction to access the V-Safe system.   Mr. Don Palmer was instructed to call 911 with any severe reactions post vaccine: Marland Kitchen Difficulty breathing  . Swelling of face and throat  . A fast heartbeat  . A bad rash all over body  . Dizziness and weakness   Immunizations Administered    Name Date Dose VIS Date Route   Pfizer COVID-19 Vaccine 08/07/2019  2:21 PM 0.3 mL 06/14/2018 Intramuscular   Manufacturer: [employer], [employer]   Lot: [id number]   NDC: [address]-0

## 2019-10-13 ENCOUNTER — Other Ambulatory Visit: Payer: Self-pay

## 2019-10-13 ENCOUNTER — Encounter (HOSPITAL_COMMUNITY): Payer: Self-pay | Admitting: Emergency Medicine

## 2019-10-13 ENCOUNTER — Emergency Department (HOSPITAL_COMMUNITY)
Admission: EM | Admit: 2019-10-13 | Discharge: 2019-10-13 | Disposition: A | Discharge status: Home / Self Care | Payer: BC Managed Care – PPO | Attending: Emergency Medicine | Admitting: Emergency Medicine

## 2019-10-13 DIAGNOSIS — H18892 Other specified disorders of cornea, left eye: Secondary | ICD-10-CM

## 2019-10-13 DIAGNOSIS — H5789 Other specified disorders of eye and adnexa: Secondary | ICD-10-CM | POA: Diagnosis not present

## 2019-10-13 DIAGNOSIS — H5712 Ocular pain, left eye: Secondary | ICD-10-CM | POA: Diagnosis present

## 2019-10-13 MED ORDER — ERYTHROMYCIN 5 MG/GM OP OINT
TOPICAL_OINTMENT | OPHTHALMIC | 0 refills | Status: DC
Start: 2019-10-13 — End: 2022-12-07

## 2019-10-13 MED ORDER — TETRACAINE HCL 0.5 % OP SOLN
1.0000 [drp] | Freq: Once | OPHTHALMIC | Status: AC
  Administered 2019-10-13: 1 [drp] via OPHTHALMIC
  Filled 2019-10-13: qty 4

## 2019-10-13 MED ORDER — FLUORESCEIN SODIUM 1 MG OP STRP
1.0000 | ORAL_STRIP | Freq: Once | OPHTHALMIC | Status: AC
  Administered 2019-10-13: 1 via OPHTHALMIC
  Filled 2019-10-13: qty 1

## 2019-10-13 NOTE — Discharge Instructions (Addendum)
Apply eye ointment 4 times daily for one week to prevent infection of the eye Please follow up with Dr. Cathey Endow (eye doctor) or the eye doctor you previously went to Return if you have any worsening symptoms (severe pain, vision loss)

## 2019-10-13 NOTE — ED Triage Notes (Signed)
Pt reports left eye pain and redness that flared up yesterday. Pt reports intermittent episodes of this for the past few months. Pt concerned that his grandson scratched his eye and that it hasn't healed. Denies drainage. Reports relief when lifting eyelid up.

## 2019-10-13 NOTE — ED Provider Notes (Signed)
[facility] EMERGENCY DEPARTMENT Provider Note   CSN: [id number] Arrival date & time: 10/13/19  0555     History Chief Complaint  Patient presents with  . Eye Pain    Don Palmer is a 49 y.o. male who presents with left eye pain and redness. He states he started to get light sensitivity and eye pain yesterday. It hurts when he looks straight ahead and better when he looks down or pulls his eyelid away from the eye. He states that about 6 months ago he was holding his 20 year old grandson and he threw his hand back and his fingernail cut his L eye. He had significant irritation after that and the pain didn't go away for a couple weeks. Since then he will have a flare up of eye pain and redness without any additional trauma. He works on Therapist, art for [employer] and states that he always wears eye protection at work. He does not wear eye glasses or contacts. He denies fever or chills. No pain in the R eye. He denies any decrease in visual acuity. He tried OTC eye drops without relief.   HPI     Past Medical History:  Diagnosis Date  . Brain abscess   . Head injury, closed, with brief LOC ([facility]) 2012  . Headache(784.0)    has subdural hemotoma  . No pertinent past medical history   . Subdural hematoma [facility])     Patient Active Problem List   Diagnosis Date Noted  . 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 Brain abscess 05/06/2011  . Subdural hematoma ([facility]) 05/06/2011  . Expressive aphasia 05/06/2011    Past Surgical History:  Procedure Laterality Date  . CRANIOTOMY  03/02/2011   Procedure: CRANIOTOMY HEMATOMA EVACUATION SUBDURAL;  Surgeon: Clydene Fake;  Location: [facility];  Service: Neurosurgery;  Laterality: Left;  . CRANIOTOMY  03/31/2011   Procedure: CRANIOTOMY HEMATOMA EVACUATION SUBDURAL;  Surgeon: Clydene Fake;  Location: [facility]S;  Service: Neurosurgery;  Laterality: Left;  Left Craniectomy for Subdural Abcess  .  CRANIOTOMY  02/04/2012   Procedure: CRANIOTOMY BONE FLAP/PROSTHETIC PLATE;  Surgeon: Clydene Fake, MD;  Location: [facility]UR[facility]S;  Service: Neurosurgery;  Laterality: Left;  Cranioplasty with Cranila mesh implant  . SHOULDER OPEN ROTATOR CUFF REPAIR     2010  left shoulder       Family History  Family history unknown: Yes    Social History   Tobacco Use  . Smoking status: Never Smoker  . Smokeless tobacco: Never Used  . Tobacco comment: Quit in 1998  Substance Use Topics  . Alcohol use: Yes    Alcohol/week: 2.0 standard drinks    Types: 2 Cans of beer per week  . Drug use: No    Home Medications Prior to Admission medications   Medication Sig Start Date End Date Taking? Authorizing Provider  ibuprofen (ADVIL) 800 MG tablet Take 1 tablet (800 mg total) by mouth 3 (three) times daily. 10/17/18   Eustace Moore, MD  methocarbamol (ROBAXIN) 500 MG tablet Take 1 tablet (500 mg total) by mouth at bedtime and may repeat dose one time if needed. 12/05/18   Bethel Born, PA-C    Allergies    Patient has no known allergies.  Review of Systems   Review of Systems  Constitutional: Negative for fever.  Eyes: Positive for photophobia, pain, discharge and redness. Negative for itching and visual disturbance.  Physical Exam Updated Vital Signs BP 129/88 (BP Location: Left Arm)   Pulse 69   Temp 98.3 F (36.8 C) (Oral)   Resp 16   Ht 5\' 7"  (1.702 m)   Wt 85.7 kg   SpO2 98%   BMI 29.60 kg/m   Physical Exam Vitals and nursing note reviewed.  Constitutional:      General: He is not in acute distress.    Appearance: Normal appearance. He is well-developed. He is not ill-appearing.  HENT:     Head: Normocephalic and atraumatic.  Eyes:     General: Lids are normal. Lids are everted, no foreign bodies appreciated. Vision grossly intact. Gaze aligned appropriately. No allergic shiner or scleral icterus.       Right eye: No foreign body or discharge.        Left eye:  Discharge (watery) present.No foreign body.     Intraocular pressure: Left eye pressure is 11 mmHg. Measurements were taken using an automated tonometer.    Extraocular Movements: Extraocular movements intact.     Conjunctiva/sclera:     Left eye: Left conjunctiva is injected. No chemosis, exudate or hemorrhage.    Pupils: Pupils are equal, round, and reactive to light.     Right eye: Pupil is not sluggish. No corneal abrasion or fluorescein uptake.     Left eye: Pupil is not sluggish. No corneal abrasion or fluorescein uptake.     Slit lamp exam:    Left eye: Photophobia present. No corneal flare, corneal ulcer or foreign body.     Comments:   Visual Acuity  Right Eye Distance: 20/16 Left Eye Distance: 20/40 Bilateral Distance: 20/16  Pt reports relief from symptoms after tetracaine    Cardiovascular:     Rate and Rhythm: Normal rate.  Pulmonary:     Effort: Pulmonary effort is normal. No respiratory distress.  Abdominal:     General: There is no distension.  Musculoskeletal:     Cervical back: Normal range of motion.  Skin:    General: Skin is warm and dry.  Neurological:     Mental Status: He is alert and oriented to person, place, and time.  Psychiatric:        Behavior: Behavior normal.     ED Results / Procedures / Treatments   Labs (all labs ordered are listed, but only abnormal results are displayed) Labs Reviewed - No data to display  EKG None  Radiology No results found.  Procedures Procedures (including critical care time)  Medications Ordered in ED Medications  fluorescein ophthalmic strip 1 strip (has no administration in time range)  tetracaine (PONTOCAINE) 0.5 % ophthalmic solution 1 drop (has no administration in time range)    ED Course  I have reviewed the triage vital signs and the nursing notes.  Pertinent labs & imaging results that were available during my care of the patient were reviewed by me and considered in my medical decision  making (see chart for details).  49 year old male presents with left eye irritation and redness.  He feels like there is a possible foreign body versus abrasion of the eye in the upper area.  There was no visible foreign body with direct visual visualization.  The lid was everted and still no visible foreign body was seen.  Visual acuity was obtained and is slightly decreased in the left eye but is grossly intact.  Patient had complete relief of symptoms with tetracaine suggesting corneal irritation.  There is no fluorescein uptake to  suggest an abrasion.  Tono-Pen was used and intraocular pressure is normal at 11.  The eye was copiously irrigated with normal saline by me.  Patient was prescribed erythromycin ointment and advised to follow-up with ophthalmology for ongoing symptoms.  MDM Rules/Calculators/A&P                           Final Clinical Impression(s) / ED Diagnoses Final diagnoses:  Corneal irritation of left eye    Rx / DC Orders ED Discharge Orders    None       Recardo Evangelist, PA-C 10/13/19 1111    Quintella Reichert, MD 10/16/19 [phone]

## 2020-11-05 ENCOUNTER — Other Ambulatory Visit: Payer: Self-pay

## 2020-11-05 ENCOUNTER — Ambulatory Visit (HOSPITAL_COMMUNITY)
Admission: EM | Admit: 2020-11-05 | Discharge: 2020-11-05 | Disposition: A | Discharge status: Home / Self Care | Payer: BC Managed Care – PPO

## 2020-11-05 ENCOUNTER — Encounter (HOSPITAL_COMMUNITY): Payer: Self-pay

## 2020-11-05 DIAGNOSIS — R1032 Left lower quadrant pain: Secondary | ICD-10-CM

## 2020-11-05 NOTE — Discharge Instructions (Addendum)
Make sure you drink plenty of fluids and try to increase the amount of fiber in your diet.    You can take Ibuprofen and/or Tylenol as needed for pain relief.   Follow up with [facility] for re-evaluation of possible hernia.   Go to the ED for any worsening symptoms, such as worsening pain, nausea, vomiting.

## 2020-11-05 NOTE — ED Triage Notes (Signed)
Pt c/o LLQ abd pain (had hernia repair in 2021 per pt).  Start: 2-3 weeks ago, now constant pain Interventions: none

## 2020-11-05 NOTE — ED Provider Notes (Signed)
[facility]    CSN: [id number] Arrival date & time: 11/05/20  1556      History   Chief Complaint Chief Complaint  Patient presents with   Abdominal Pain    HPI Don Palmer is a 50 y.o. male.   Patient here for evaluation of left lower quadrant pain that started few weeks ago.  Reports had a hernia that was repaired last year by [facility] surgery.  Reports pain feels similar to hernia pain.  Has not taken any OTC medications or treatments. Denies any trauma, injury, or other precipitating event.  Denies any fevers, chest pain, shortness of breath, N/V/D, numbness, tingling, weakness, or headaches.     The history is provided by the patient.  Abdominal Pain Associated symptoms: no nausea and no vomiting    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   Subdural hematoma ([facility]) 05/06/2011   Expressive aphasia 05/06/2011    Past Surgical History:  Procedure Laterality Date   CRANIOTOMY  03/02/2011   Procedure: CRANIOTOMY HEMATOMA EVACUATION SUBDURAL;  Surgeon: Clydene Fake;  Location: [facility];  Service: Neurosurgery;  Laterality: Left;   CRANIOTOMY  03/31/2011   Procedure: CRANIOTOMY HEMATOMA EVACUATION SUBDURAL;  Surgeon: Clydene Fake;  Location: [facility];  Service: Neurosurgery;  Laterality: Left;  Left Craniectomy for Subdural Abcess   CRANIOTOMY  02/04/2012   Procedure: CRANIOTOMY BONE FLAP/PROSTHETIC PLATE;  Surgeon: Clydene Fake, MD;  Location: [facility];  Service: Neurosurgery;  Laterality: Left;  Cranioplasty with Cranila mesh implant   SHOULDER OPEN ROTATOR CUFF REPAIR     2010  left shoulder       Home Medications     Prior to Admission medications   Medication Sig Start Date End Date Taking? Authorizing Provider  erythromycin ophthalmic ointment Place a 1/2 inch ribbon of ointment into the lower eyelid. 10/13/19   Bethel Born, PA-C  ibuprofen (ADVIL) 800 MG tablet Take 1 tablet (800 mg total) by mouth 3 (three) times daily. 10/17/18   Eustace Moore, MD  methocarbamol (ROBAXIN) 500 MG tablet Take 1 tablet (500 mg total) by mouth at bedtime and may repeat dose one time if needed. 12/05/18   Bethel Born, PA-C    Family History Family History  Family history unknown: Yes    Social History Social History   Tobacco Use   Smoking status: Never   Smokeless tobacco: Never   Tobacco comments:    Quit in 1998  Substance Use Topics   Alcohol use: Yes    Alcohol/week: 2.0 standard drinks    Types: 2 Cans of beer per week   Drug use: No     Allergies   Patient has no known allergies.   Review of Systems Review of Systems  Gastrointestinal:  Positive for abdominal pain. Negative for abdominal distention, nausea and vomiting.  All other systems reviewed and are negative.   Physical Exam Triage Vital Signs ED Triage Vitals  Enc Vitals Group     BP 11/05/20 1659 136/80     Pulse Rate 11/05/20 1659 65     Resp  11/05/20 1659 18     Temp 11/05/20 1659 98.1 F (36.7 C)     Temp Source 11/05/20 1659 Oral     SpO2 11/05/20 1659 98 %     Weight --      Height --      Head Circumference --      Peak Flow --      Pain Score 11/05/20 1657 10     Pain Loc --      Pain Edu? --      Excl. in GC? --    No data found.  Updated Vital Signs BP 136/80 (BP Location: Left Arm)   Pulse 65   Temp 98.1 F (36.7 C) (Oral)   Resp 18   SpO2 98%   Visual Acuity Right Eye Distance:   Left Eye Distance:   Bilateral Distance:    Right Eye Near:   Left Eye Near:    Bilateral Near:     Physical Exam Vitals and nursing note reviewed.  Constitutional:      General: He is not in  acute distress.    Appearance: Normal appearance. He is not ill-appearing, toxic-appearing or diaphoretic.  HENT:     Head: Normocephalic and atraumatic.  Eyes:     Conjunctiva/sclera: Conjunctivae normal.  Cardiovascular:     Rate and Rhythm: Normal rate.     Pulses: Normal pulses.  Pulmonary:     Effort: Pulmonary effort is normal.  Abdominal:     General: Abdomen is flat. Bowel sounds are normal. There is no distension.     Palpations: Abdomen is soft. There is no shifting dullness, fluid wave, hepatomegaly, splenomegaly, mass or pulsatile mass.     Tenderness: There is no abdominal tenderness.  Musculoskeletal:        General: Normal range of motion.     Cervical back: Normal range of motion.  Skin:    General: Skin is warm and dry.  Neurological:     General: No focal deficit present.     Mental Status: He is alert and oriented to person, place, and time.  Psychiatric:        Mood and Affect: Mood normal.     UC Treatments / Results  Labs (all labs ordered are listed, but only abnormal results are displayed) Labs Reviewed - No data to display  EKG   Radiology No results found.  Procedures Procedures (including critical care time)  Medications Ordered in UC Medications - No data to display  Initial Impression / Assessment and Plan / UC Course  I have reviewed the triage vital signs and the nursing notes.  Pertinent labs & imaging results that were available during my care of the patient were reviewed by me and considered in my medical decision making (see chart for details).    Assessment negative for red flags or concerns.  Left lower quadrant abdominal pain.  Possible hernia but no signs of incarceration or infection.  Encourage fluids and rest.  May take Tylenol and/or ibuprofen as needed for pain.  Recommend following up with [place] surgery for reevaluation. Final Clinical Impressions(s) / UC Diagnoses   Final diagnoses:  Left lower quadrant  abdominal pain     Discharge Instructions      Make sure you drink plenty of fluids and try to increase the amount of fiber in your diet.    You can take Ibuprofen and/or Tylenol as needed for pain relief.   Follow up with [facility]  for re-evaluation of possible hernia.   Go to the ED for any worsening symptoms, such as worsening pain, nausea, vomiting.       ED Prescriptions   None    PDMP not reviewed this encounter.   Ivette Loyal, NP 11/05/20 1745

## 2020-12-27 ENCOUNTER — Other Ambulatory Visit: Payer: Self-pay | Admitting: General Surgery

## 2020-12-27 DIAGNOSIS — Z8719 Personal history of other diseases of the digestive system: Secondary | ICD-10-CM

## 2020-12-27 DIAGNOSIS — Z9889 Other specified postprocedural states: Secondary | ICD-10-CM

## 2020-12-27 DIAGNOSIS — R1032 Left lower quadrant pain: Secondary | ICD-10-CM

## 2020-12-31 ENCOUNTER — Ambulatory Visit
Admission: RE | Admit: 2020-12-31 | Discharge: 2020-12-31 | Disposition: A | Discharge status: Home / Self Care | Payer: BC Managed Care – PPO | Source: Ambulatory Visit | Attending: General Surgery | Admitting: General Surgery

## 2020-12-31 DIAGNOSIS — Z8719 Personal history of other diseases of the digestive system: Secondary | ICD-10-CM

## 2020-12-31 DIAGNOSIS — R1032 Left lower quadrant pain: Secondary | ICD-10-CM

## 2021-03-26 ENCOUNTER — Encounter: Payer: Self-pay | Admitting: Physical Therapy

## 2021-03-26 ENCOUNTER — Ambulatory Visit: Payer: BC Managed Care – PPO | Attending: General Surgery | Admitting: Physical Therapy

## 2021-03-26 ENCOUNTER — Other Ambulatory Visit: Payer: Self-pay

## 2021-03-26 DIAGNOSIS — M6281 Muscle weakness (generalized): Secondary | ICD-10-CM | POA: Diagnosis present

## 2021-03-26 DIAGNOSIS — M25552 Pain in left hip: Secondary | ICD-10-CM | POA: Diagnosis present

## 2021-03-26 DIAGNOSIS — M25652 Stiffness of left hip, not elsewhere classified: Secondary | ICD-10-CM | POA: Insufficient documentation

## 2021-03-26 NOTE — Therapy (Signed)
[facility] [address] [place], [place], [address] Phone: [phone]   Fax:  [phone]  Physical Therapy Evaluation  Patient Details  Name: Don Palmer MRN: [id number] Date of Birth: August 08, 1970 Referring Provider (PT): Gaynelle Adu, MD   Encounter Date: 03/26/2021   PT End of Session - 03/26/21 1224     Visit Number 1    Date for PT Re-Evaluation 06/18/21    Authorization Type BCBS    PT Start Time 1100    PT Stop Time 1144    PT Time Calculation (min) 44 min    Activity Tolerance Patient tolerated treatment well    Behavior During Therapy [facility] for tasks assessed/performed             Past Medical History:  Diagnosis Date   Brain abscess    Head injury, closed, with brief LOC (HCC) 2012   Headache(784.0)    has subdural hemotoma   No pertinent past medical history    Subdural hematoma     Past Surgical History:  Procedure Laterality Date   CRANIOTOMY  03/02/2011   Procedure: CRANIOTOMY HEMATOMA EVACUATION SUBDURAL;  Surgeon: Clydene Fake;  Location: [facility];  Service: Neurosurgery;  Laterality: Left;   CRANIOTOMY  03/31/2011   Procedure: CRANIOTOMY HEMATOMA EVACUATION SUBDURAL;  Surgeon: Clydene Fake;  Location: [facility];  Service: Neurosurgery;  Laterality: Left;  Left Craniectomy for Subdural Abcess   CRANIOTOMY  02/04/2012   Procedure: CRANIOTOMY BONE FLAP/PROSTHETIC PLATE;  Surgeon: Clydene Fake, MD;  Location: [facility];  Service: Neurosurgery;  Laterality: Left;  Cranioplasty with Cranila mesh implant   SHOULDER OPEN ROTATOR CUFF REPAIR     2010  left shoulder    There were no vitals filed for this visit.    Subjective Assessment - 03/26/21 1108     Subjective Pt reports pain in lower abdomen and and pain behind the scrotum.  Sometimes the left testicle swells. Pt has to double void sometimes after leaving the bathroom and sit down. Pt drinks a lot of water 3x32oz per  day.                [facility] PT Assessment - 03/26/21 0001       Assessment   Medical Diagnosis R10.32 (ICD-10-CM) - Left lower quadrant pain;S39.81XA (ICD-10-CM) - Other specified injuries of abdomen, initial encounter;Z98.890,Z87.19 (ICD-10-CM) - H/O left inguinal hernia repair    Referring Provider (PT) Gaynelle Adu, MD      Precautions   Precautions None      Balance Screen   Has the patient fallen in the past 6 months No      Home Environment   Living Environment Private residence    Living Arrangements Spouse/significant other      Prior Function   Level of Independence Independent    Vocation Full time employment    Vocation Requirements standing and walking      Cognition   Overall Cognitive Status Within Functional Limits for tasks assessed      Observation/Other Assessments   Observations unable to sit upright without pain, sits slumped on tailbone      Posture/Postural Control   Posture/Postural Control No significant limitations      ROM / Strength   AROM / PROM / Strength AROM;PROM;Strength      AROM   AROM Assessment Site Hip;Lumbar    Right/Left Hip Right;Left    Right Hip Flexion 90  pain   Left Hip Flexion 80    Left Hip Internal Rotation  --   50% +pain   Lumbar Flexion 50% +pain    Lumbar Extension WFL    Lumbar - Right Side Bend 75%    Lumbar - Left Side Bend 75%    Lumbar - Right Rotation 50% +pain    Lumbar - Left Rotation 75%      PROM   Overall PROM Comments hip flexion and IR 50% Lt side      Strength   Overall Strength Comments Lt hip abduction and ext 4/5      Flexibility   Soft Tissue Assessment /Muscle Length yes    Hamstrings 50% +pain bil      Palpation   Palpation comment lumbar, gluteals, adductions tight and TTP Lt side      Ambulation/Gait   Gait Pattern Within Functional Limits                        Objective measurements completed on examination: See above findings.     Pelvic Floor Special  Questions - 03/26/21 0001     Currently Sexually Active Yes    Urinary Leakage No    Urinary urgency Yes    Urinary frequency more than normal    Fecal incontinence No    External Palpation external to underwear - bulbo and ischiocav tight and TTP Lt side; incisions TTP    Exam Type Deferred                         PT Short Term Goals - 03/26/21 1204       PT SHORT TERM GOAL #1   Title ind with initial HEP    Time 4    Period Weeks    Status New    Target Date 04/23/21      PT SHORT TERM GOAL #2   Title Pt will report at least 20% less pain    Time 4    Period Weeks    Status New    Target Date 04/23/21      PT SHORT TERM GOAL #3   Title Pt will have hip flexion past 90 deg without pain to improve sitting posture    Time 4    Period Weeks    Status New    Target Date 04/23/21               PT Long Term Goals - 03/26/21 1209       PT LONG TERM GOAL #1   Title Pt will be ind with advanced HEP for return to work without pain    Time 12    Period Weeks    Status New    Target Date 06/18/21      PT LONG TERM GOAL #2   Title Pt will report at least 75% less pain    Time 12    Period Weeks    Status New    Target Date 06/18/21      PT LONG TERM GOAL #3   Title Pt will demonstarte at least 120 deg hip flexion bil without increased pain    Baseline 90 +pain    Time 12    Period Weeks    Status New      PT LONG TERM GOAL #4   Title Pt will be able to sit and stand for at least 4 hours  to perform job related tasks without increased pain    Time 12    Period Weeks    Status New    Target Date 06/18/21      PT LONG TERM GOAL #5   Title Pt will demonstrate squat and be able to lift at least 20 lb with good technique for return to normal activities    Time 12    Period Weeks    Status New    Target Date 06/18/21                    Plan - 03/26/21 1154     Clinical Impression Statement Pt presents to clinic due to inguinal  and perineal pain since hernia repair surgery.  Pt has lowe left quadrant swelling under the incision site.  No redness or oozing and incision is well healed.  pt has weakness of hip abduction and ext on the left.  tight lumbar and gluteals bil . There is significant TTP left adductors, and bulbospongeosis and ischiocavernosis muscles.  Pt has tension throughout the lumbar with pain with flexion and Rt rotation.  Pt has limited hip flexion bil and Lt hip IR with pain.  Pt will benefit from skilled PT to address all above mentioned impairments and return to normal active lifestyle and job related activities.    Personal Factors and Comorbidities Comorbidity 2    Comorbidities 2 hernia repair surgeries    Examination-Activity Limitations Carry;Lift;Squat;Bend;Sleep;Sit    Examination-Participation Restrictions Occupation;Community Activity;Driving    Stability/Clinical Decision Making Stable/Uncomplicated    Clinical Decision Making Moderate    Rehab Potential Excellent    PT Frequency 2x / week    PT Duration 12 weeks    PT Treatment/Interventions ADLs/Self Care Home Management;Biofeedback;Cryotherapy;Electrical Stimulation;Moist Heat;Therapeutic activities;Therapeutic exercise;Neuromuscular re-education;Patient/family education;Taping;Scar mobilization;Manual techniques;Passive range of motion;Dry needling    PT Next Visit Plan tens with ice for pain management, fascial release to lumbar and gluteals    PT Home Exercise Plan tennis ball massage glutes    Consulted and Agree with Plan of Care Patient             Patient will benefit from skilled therapeutic intervention in order to improve the following deficits and impairments:  Decreased endurance, Difficulty walking, Increased muscle spasms, Pain, Impaired flexibility, Decreased strength  Visit Diagnosis: Stiffness of left hip, not elsewhere classified - Plan: PT plan of care cert/re-cert  Pain in left hip - Plan: PT plan of care  cert/re-cert  Muscle weakness (generalized) - Plan: PT plan of care cert/re-cert     Problem List Patient Active Problem List   Diagnosis Date Noted   Infection due to Enterobacter cloacae 05/07/2011   Brain abscess 05/06/2011   Subdural hematoma 05/06/2011   Expressive aphasia 05/06/2011    Junious Silk, PT 03/26/2021, 12:32 PM  [facility] @ [facility] [address] [place], [place], [address] Phone: [phone]   Fax:  [phone]  Name: Don Palmer MRN: [id number] Date of Birth: [birth year]

## 2021-04-01 ENCOUNTER — Encounter: Payer: Self-pay | Admitting: Physical Therapy

## 2021-04-01 ENCOUNTER — Ambulatory Visit: Payer: BC Managed Care – PPO | Admitting: Physical Therapy

## 2021-04-01 ENCOUNTER — Other Ambulatory Visit: Payer: Self-pay

## 2021-04-01 DIAGNOSIS — M25552 Pain in left hip: Secondary | ICD-10-CM

## 2021-04-01 DIAGNOSIS — M25652 Stiffness of left hip, not elsewhere classified: Secondary | ICD-10-CM | POA: Diagnosis not present

## 2021-04-01 DIAGNOSIS — M6281 Muscle weakness (generalized): Secondary | ICD-10-CM

## 2021-04-01 NOTE — Patient Instructions (Signed)
Access Code: [email or web address] URL: [email or web address] Date: 04/01/2021 Prepared by: Dwana Curd  Exercises Hip Flexor Stretch with Chair - 1 x daily - 7 x weekly - 1 sets - 3 reps - 30 sec hold Seated Isometric Hip Internal Rotation - 1 x daily - 7 x weekly - 3 sets - 10 reps

## 2021-04-01 NOTE — Therapy (Signed)
[facility] @ [facility] [address] [place], [place], [address] Phone: [phone]   Fax:  [phone]  Physical Therapy Treatment  Patient Details  Name: Don Palmer MRN: [id number] Date of Birth: September 28, 1970 Referring Provider (PT): Gaynelle Adu, MD   Encounter Date: 04/01/2021   PT End of Session - 04/01/21 0853     Visit Number 2    Date for PT Re-Evaluation 06/18/21    Authorization Type BCBS    PT Start Time 0847    PT Stop Time 0927    PT Time Calculation (min) 40 min    Activity Tolerance Patient tolerated treatment well    Behavior During Therapy [facility] for tasks assessed/performed             Past Medical History:  Diagnosis Date   Brain abscess    Head injury, closed, with brief LOC (HCC) 2012   Headache(784.0)    has subdural hemotoma   No pertinent past medical history    Subdural hematoma     Past Surgical History:  Procedure Laterality Date   CRANIOTOMY  03/02/2011   Procedure: CRANIOTOMY HEMATOMA EVACUATION SUBDURAL;  Surgeon: Clydene Fake;  Location: [facility];  Service: Neurosurgery;  Laterality: Left;   CRANIOTOMY  03/31/2011   Procedure: CRANIOTOMY HEMATOMA EVACUATION SUBDURAL;  Surgeon: Clydene Fake;  Location: [facility];  Service: Neurosurgery;  Laterality: Left;  Left Craniectomy for Subdural Abcess   CRANIOTOMY  02/04/2012   Procedure: CRANIOTOMY BONE FLAP/PROSTHETIC PLATE;  Surgeon: Clydene Fake, MD;  Location: [facility];  Service: Neurosurgery;  Laterality: Left;  Cranioplasty with Cranila mesh implant   SHOULDER OPEN ROTATOR CUFF REPAIR     2010  left shoulder    There were no vitals filed for this visit.   Subjective Assessment - 04/01/21 0849     Subjective Pt states pain is the same and tender when I touch it.    Currently in Pain? Yes    Pain Score 5     Pain Location Abdomen    Pain Orientation Left;Lower    Pain Descriptors / Indicators Throbbing;Sore    Pain  Type Surgical pain    Pain Onset 1 to 4 weeks ago    Aggravating Factors  sitting a long time and doing a lot of activity    Pain Relieving Factors ice and heat    Multiple Pain Sites No                               [facility] Adult PT Treatment/Exercise - 04/01/21 0001       Exercises   Exercises Knee/Hip      Knee/Hip Exercises: Stretches   Hip Flexor Stretch Right;Left    Hip Flexor Stretch Limitations moving back and forth to loosen joints for 1 min each side    Other Knee/Hip Stretches hip rotation IR/ER in sitting LE straight or knees bent    Other Knee/Hip Stretches LE pendulums Left LE and LE hang      Manual Therapy   Manual Therapy Myofascial release;Joint mobilization    Joint Mobilization gentle left long axis distraction    Myofascial Release left trunk, TFL, quad                     PT Education - 04/01/21 0942     Education Details Access Code: [email or web address]  Person(s) Educated Patient    Methods Explanation;Demonstration;Verbal cues;Handout;Tactile cues    Comprehension Verbalized understanding;Returned demonstration              PT Short Term Goals - 04/01/21 0853       PT SHORT TERM GOAL #1   Title ind with initial HEP    Status On-going      PT SHORT TERM GOAL #2   Title Pt will report at least 20% less pain    Status On-going      PT SHORT TERM GOAL #3   Title Pt will have hip flexion past 90 deg without pain to improve sitting posture    Status On-going               PT Long Term Goals - 03/26/21 1209       PT LONG TERM GOAL #1   Title Pt will be ind with advanced HEP for return to work without pain    Time 12    Period Weeks    Status New    Target Date 06/18/21      PT LONG TERM GOAL #2   Title Pt will report at least 75% less pain    Time 12    Period Weeks    Status New    Target Date 06/18/21      PT LONG TERM GOAL #3   Title Pt will demonstarte at least 120 deg hip flexion bil without  increased pain    Baseline 90 +pain    Time 12    Period Weeks    Status New      PT LONG TERM GOAL #4   Title Pt will be able to sit and stand for at least 4 hours to perform job related tasks without increased pain    Time 12    Period Weeks    Status New    Target Date 06/18/21      PT LONG TERM GOAL #5   Title Pt will demonstrate squat and be able to lift at least 20 lb with good technique for return to normal activities    Time 12    Period Weeks    Status New    Target Date 06/18/21                   Plan - 04/01/21 1779     Clinical Impression Statement Pt is still very tender to palpation even in left thigh, flank, and TFL.  Pt has tenderness to the incision site with fascial pulling so focused more on regions that were not directly on the incision. Pt felt relief with long axis distraction of the left leg.  Pt was given stretches to continue to release fascia and and increase hip mobility.    PT Treatment/Interventions ADLs/Self Care Home Management;Biofeedback;Cryotherapy;Electrical Stimulation;Moist Heat;Therapeutic activities;Therapeutic exercise;Neuromuscular re-education;Patient/family education;Taping;Scar mobilization;Manual techniques;Passive range of motion;Dry needling    PT Next Visit Plan fascial release as tolerated, distraction lt LE, f/u on HEP and progress hip and lumbar ROM as tolerated, TENS for pain management    PT Home Exercise Plan Access Code: [email or web address]    Consulted and Agree with Plan of Care Patient             Patient will benefit from skilled therapeutic intervention in order to improve the following deficits and impairments:  Decreased endurance, Difficulty walking, Increased muscle spasms, Pain, Impaired flexibility, Decreased strength  Visit Diagnosis: Stiffness of left hip, not elsewhere  classified  Pain in left hip  Muscle weakness (generalized)     Problem List Patient Active Problem List   Diagnosis Date Noted    Infection due to Enterobacter cloacae 05/07/2011   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 [place], [place], [address] Phone: [phone]   Fax:  [phone]  Name: DAVIDSON PALMIERI MRN: [id number] Date of Birth: Apr 05, 1971

## 2021-04-08 ENCOUNTER — Encounter: Payer: BC Managed Care – PPO | Admitting: Physical Therapy

## 2021-04-18 ENCOUNTER — Encounter: Payer: Self-pay | Admitting: Physical Therapy

## 2021-04-22 ENCOUNTER — Ambulatory Visit: Payer: BC Managed Care – PPO | Attending: General Surgery | Admitting: Physical Therapy

## 2021-04-22 ENCOUNTER — Other Ambulatory Visit: Payer: Self-pay

## 2021-04-22 DIAGNOSIS — M25552 Pain in left hip: Secondary | ICD-10-CM | POA: Diagnosis present

## 2021-04-22 DIAGNOSIS — M25652 Stiffness of left hip, not elsewhere classified: Secondary | ICD-10-CM | POA: Diagnosis not present

## 2021-04-22 DIAGNOSIS — M6281 Muscle weakness (generalized): Secondary | ICD-10-CM | POA: Diagnosis present

## 2021-04-22 NOTE — Patient Instructions (Signed)
Access Code: [email or web address] URL: [email or web address] Date: 04/22/2021 Prepared by: Dwana Curd  Exercises Hip Flexor Stretch with Chair - 1 x daily - 7 x weekly - 1 sets - 3 reps - 30 sec hold Seated Isometric Hip Internal Rotation - 1 x daily - 7 x weekly - 3 sets - 10 reps Supine Butterfly Groin Stretch - 1 x daily - 7 x weekly - 3 sets - 10 reps Hooklying Isometric Clamshell - 1 x daily - 7 x weekly - 3 sets - 10 reps Sidelying Hip Abduction - 1 x daily - 7 x weekly - 3 sets - 10 reps

## 2021-04-22 NOTE — Therapy (Signed)
[facility] [address] [place], [place], [address] Phone: [phone]   Fax:  [phone]  Physical Therapy Treatment  Patient Details  Name: Don Palmer MRN: [id number] Date of Birth: March 15, 1971 Referring Provider (PT): Gaynelle Adu, MD   Encounter Date: 04/22/2021   PT End of Session - 04/22/21 1143     Visit Number 3    Date for PT Re-Evaluation 06/18/21    Authorization Type BCBS    PT Start Time 0937    PT Stop Time 1015    PT Time Calculation (min) 38 min    Activity Tolerance Patient tolerated treatment well    Behavior During Therapy [facility] for tasks assessed/performed             Past Medical History:  Diagnosis Date   Brain abscess    Head injury, closed, with brief LOC (HCC) 2012   Headache(784.0)    has subdural hemotoma   No pertinent past medical history    Subdural hematoma     Past Surgical History:  Procedure Laterality Date   CRANIOTOMY  03/02/2011   Procedure: CRANIOTOMY HEMATOMA EVACUATION SUBDURAL;  Surgeon: Clydene Fake;  Location: [facility];  Service: Neurosurgery;  Laterality: Left;   CRANIOTOMY  03/31/2011   Procedure: CRANIOTOMY HEMATOMA EVACUATION SUBDURAL;  Surgeon: Clydene Fake;  Location: [facility];  Service: Neurosurgery;  Laterality: Left;  Left Craniectomy for Subdural Abcess   CRANIOTOMY  02/04/2012   Procedure: CRANIOTOMY BONE FLAP/PROSTHETIC PLATE;  Surgeon: Clydene Fake, MD;  Location: [facility];  Service: Neurosurgery;  Laterality: Left;  Cranioplasty with Cranila mesh implant   SHOULDER OPEN ROTATOR CUFF REPAIR     2010  left shoulder    There were no vitals filed for this visit.   Subjective Assessment - 04/22/21 1144     Subjective It is still very sore underneath and lower ab.  I got flared up to 8/10 yesterday watching 53 year old twins.    Currently in Pain? Yes    Pain Score 5     Pain Location Abdomen    Pain Orientation Left;Lower     Pain Descriptors / Indicators Sore    Pain Type Surgical pain    Pain Onset More than a month ago    Pain Frequency Intermittent    Multiple Pain Sites No                               [facility] Adult PT Treatment/Exercise - 04/22/21 0001       Knee/Hip Exercises: Stretches   Active Hamstring Stretch Right;Left;3 reps;20 seconds    Hip Flexor Stretch Right;Left;30 seconds      Knee/Hip Exercises: Aerobic   Nustep L2 x 6 min   kept Lt hip in slight ER     Knee/Hip Exercises: Supine   Other Supine Knee/Hip Exercises hip abduction yellow band 2x10 single leg      Knee/Hip Exercises: Sidelying   Hip ABduction Strengthening;Left;2 sets;10 reps   toe straight and up                    PT Education - 04/22/21 1150     Education Details Access Code: [email or web address]    Person(s) Educated Patient    Methods Explanation;Tactile cues;Verbal cues;Demonstration;Handout    Comprehension Verbalized understanding;Returned demonstration  PT Short Term Goals - 04/22/21 1143       PT SHORT TERM GOAL #1   Title ind with initial HEP    Status Achieved      PT SHORT TERM GOAL #2   Title Pt will report at least 20% less pain    Status On-going      PT SHORT TERM GOAL #3   Title Pt will have hip flexion past 90 deg without pain to improve sitting posture    Status On-going               PT Long Term Goals - 04/22/21 1143       PT LONG TERM GOAL #1   Title Pt will be ind with advanced HEP for return to work without pain    Status On-going      PT LONG TERM GOAL #2   Title Pt will report at least 75% less pain    Status On-going      PT LONG TERM GOAL #3   Title Pt will demonstarte at least 120 deg hip flexion bil without increased pain    Status On-going      PT LONG TERM GOAL #4   Title Pt will be able to sit and stand for at least 4 hours to perform job related tasks without increased pain    Status On-going      PT LONG TERM  GOAL #5   Title Pt will demonstrate squat and be able to lift at least 20 lb with good technique for return to normal activities    Status On-going                   Plan - 04/22/21 1139     Clinical Impression Statement Pt was able to focus more on strengthening today.  he is still very inflamed and became flared from watching his granddaughters yesterday.  Pt has increased pain with hip flexion and internal rotation.  Pt was given basic core with gluteal strength today.  He is still using his left leg less and demonstrates weakness in the left gluteal muscles.  Pt will benefit from skilled PT to continue working on strength and pain management.    PT Treatment/Interventions ADLs/Self Care Home Management;Biofeedback;Cryotherapy;Electrical Stimulation;Moist Heat;Therapeutic activities;Therapeutic exercise;Neuromuscular re-education;Patient/family education;Taping;Scar mobilization;Manual techniques;Passive range of motion;Dry needling    PT Next Visit Plan f/u on glute strength and hip ROM ex's and progress as tolerated    PT Home Exercise Plan Access Code: [email or web address]    Consulted and Agree with Plan of Care Patient             Patient will benefit from skilled therapeutic intervention in order to improve the following deficits and impairments:  Decreased endurance, Difficulty walking, Increased muscle spasms, Pain, Impaired flexibility, Decreased strength  Visit Diagnosis: Stiffness of left hip, not elsewhere classified  Pain in left hip  Muscle weakness (generalized)     Problem List Patient Active Problem List   Diagnosis Date Noted   Infection due to Enterobacter cloacae 05/07/2011   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

## 2021-04-29 ENCOUNTER — Other Ambulatory Visit: Payer: Self-pay

## 2021-04-29 ENCOUNTER — Encounter: Payer: Self-pay | Admitting: Physical Therapy

## 2021-04-29 ENCOUNTER — Ambulatory Visit: Payer: BC Managed Care – PPO | Admitting: Physical Therapy

## 2021-04-29 DIAGNOSIS — M25652 Stiffness of left hip, not elsewhere classified: Secondary | ICD-10-CM

## 2021-04-29 DIAGNOSIS — M25552 Pain in left hip: Secondary | ICD-10-CM

## 2021-04-29 DIAGNOSIS — M6281 Muscle weakness (generalized): Secondary | ICD-10-CM

## 2021-04-29 NOTE — Therapy (Signed)
[facility] @ [facility] [address] [place], [place], [address] Phone: [phone]   Fax:  [phone]  Physical Therapy Treatment  Patient Details  Name: Don Palmer MRN: [id number] Date of Birth: 06-Oct-1970 Referring Provider (PT): Gaynelle Adu, MD   Encounter Date: 04/29/2021   PT End of Session - 04/29/21 0902     Visit Number 4    Date for PT Re-Evaluation 06/18/21    Authorization Type BCBS    PT Start Time 0848    PT Stop Time 0930    PT Time Calculation (min) 42 min    Activity Tolerance Patient tolerated treatment well    Behavior During Therapy [facility] for tasks assessed/performed             Past Medical History:  Diagnosis Date   Brain abscess    Head injury, closed, with brief LOC (HCC) 2012   Headache(784.0)    has subdural hemotoma   No pertinent past medical history    Subdural hematoma     Past Surgical History:  Procedure Laterality Date   CRANIOTOMY  03/02/2011   Procedure: CRANIOTOMY HEMATOMA EVACUATION SUBDURAL;  Surgeon: Clydene Fake;  Location: [facility];  Service: Neurosurgery;  Laterality: Left;   CRANIOTOMY  03/31/2011   Procedure: CRANIOTOMY HEMATOMA EVACUATION SUBDURAL;  Surgeon: Clydene Fake;  Location: [facility];  Service: Neurosurgery;  Laterality: Left;  Left Craniectomy for Subdural Abcess   CRANIOTOMY  02/04/2012   Procedure: CRANIOTOMY BONE FLAP/PROSTHETIC PLATE;  Surgeon: Clydene Fake, MD;  Location: [facility];  Service: Neurosurgery;  Laterality: Left;  Cranioplasty with Cranila mesh implant   SHOULDER OPEN ROTATOR CUFF REPAIR     2010  left shoulder    There were no vitals filed for this visit.   Subjective Assessment - 04/29/21 0849     Subjective Pt reports that he is feeling better, but notices increased pain with prolonged walking; however, reports that pain does not get as severe. He states that sensitivity is improving and stretching is beneficial.     Currently in Pain? Yes    Pain Score 5     Pain Location Abdomen    Pain Orientation Left;Lower    Pain Descriptors / Indicators Sore    Pain Onset More than a month ago    Pain Frequency Intermittent                               [facility] Adult PT Treatment/Exercise - 04/29/21 0001       Exercises   Exercises Lumbar      Lumbar Exercises: Standing   Shoulder Extension Strengthening;Both;20 reps;Theraband    Theraband Level (Shoulder Extension) --   YTB   Shoulder Extension Limitations YTB; 5 x 10 sec   Isometric shoulder flexion with resistance for core activation; discussed appropriate breathing mechanics with effort   Other Standing Lumbar Exercises Wall push-ups   20x     Lumbar Exercises: Supine   Other Supine Lumbar Exercises Supine shoulder extensions for core activation; 20x, GTB      Knee/Hip Exercises: Stretches   Active Hamstring Stretch Right;Left;3 reps;30 seconds   on power plate   Hip Flexor Stretch Right;Left;30 seconds    Other Knee/Hip Stretches Split stance hip hinge for glut activation - painful in Lt groin with compression      Knee/Hip Exercises: Standing   Hip Abduction Stengthening;20  reps;Knee straight;Left   30#   Hip Extension Stengthening;4 sets;20 reps;Knee straight;Left   30#; difficulty with glute activatoin vs over-compensation with lumbar paraspinals - improved with VC/TC     Knee/Hip Exercises: Supine   Bridges Strengthening;20 reps    Other Supine Knee/Hip Exercises BKFO with pelvic stabilization 20x Bil      Knee/Hip Exercises: Sidelying   Hip ABduction Strengthening;Left;2 sets;10 reps                     PT Education - 04/29/21 0943     Education Details Pt education perofrmed on appropriate breath mechanics in order to not increase IAP inappropriately and facilitate good core activation.    Person(s) Educated Patient    Methods Explanation;Demonstration;Tactile cues;Verbal cues;Handout    Comprehension  Verbalized understanding              PT Short Term Goals - 04/22/21 1143       PT SHORT TERM GOAL #1   Title ind with initial HEP    Status Achieved      PT SHORT TERM GOAL #2   Title Pt will report at least 20% less pain    Status On-going      PT SHORT TERM GOAL #3   Title Pt will have hip flexion past 90 deg without pain to improve sitting posture    Status On-going               PT Long Term Goals - 04/22/21 1143       PT LONG TERM GOAL #1   Title Pt will be ind with advanced HEP for return to work without pain    Status On-going      PT LONG TERM GOAL #2   Title Pt will report at least 75% less pain    Status On-going      PT LONG TERM GOAL #3   Title Pt will demonstarte at least 120 deg hip flexion bil without increased pain    Status On-going      PT LONG TERM GOAL #4   Title Pt will be able to sit and stand for at least 4 hours to perform job related tasks without increased pain    Status On-going      PT LONG TERM GOAL #5   Title Pt will demonstrate squat and be able to lift at least 20 lb with good technique for return to normal activities    Status On-going                   Plan - 04/29/21 0911     Clinical Impression Statement Pt is still sore around the incision.  Pt is able to do very basic core strengthening at this time.  We were able to do some hip strengthening in standing but needed min assist to keep pelvis neutral due to core weakness.  Pt was given exericses to progress his strength and he will continue to benefit from skilled PT to work on core strength and pain management.    Personal Factors and Comorbidities Comorbidity 2    Comorbidities 2 hernia repair surgeries    Examination-Activity Limitations Carry;Lift;Squat;Bend;Sleep;Sit    Examination-Participation Restrictions Occupation;Community Activity;Driving    PT Treatment/Interventions ADLs/Self Care Home Management;Biofeedback;Cryotherapy;Electrical  Stimulation;Moist Heat;Therapeutic activities;Therapeutic exercise;Neuromuscular re-education;Patient/family education;Taping;Scar mobilization;Manual techniques;Passive range of motion;Dry needling    PT Next Visit Plan progress core strength as able, supine and sidelying and standing as able, working on exhale  with exertion    PT Home Exercise Plan Access Code: [email or web address]    Consulted and Agree with Plan of Care Patient             Patient will benefit from skilled therapeutic intervention in order to improve the following deficits and impairments:  Decreased endurance, Difficulty walking, Increased muscle spasms, Pain, Impaired flexibility, Decreased strength  Visit Diagnosis: Stiffness of left hip, not elsewhere classified  Pain in left hip  Muscle weakness (generalized)     Problem List Patient Active Problem List   Diagnosis Date Noted   Infection due to Enterobacter cloacae 05/07/2011   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

## 2021-05-06 ENCOUNTER — Encounter: Payer: Self-pay | Admitting: Physical Therapy

## 2021-05-06 ENCOUNTER — Other Ambulatory Visit: Payer: Self-pay

## 2021-05-06 ENCOUNTER — Ambulatory Visit: Payer: BC Managed Care – PPO | Admitting: Physical Therapy

## 2021-05-06 DIAGNOSIS — M6281 Muscle weakness (generalized): Secondary | ICD-10-CM

## 2021-05-06 DIAGNOSIS — M25552 Pain in left hip: Secondary | ICD-10-CM

## 2021-05-06 DIAGNOSIS — M25652 Stiffness of left hip, not elsewhere classified: Secondary | ICD-10-CM

## 2021-05-06 NOTE — Therapy (Signed)
[facility] @ [place], [place], [address] Phone: [phone]   Fax:  [phone]  Physical Therapy Treatment  Patient Details  Name: Don Palmer MRN: [id number] Date of Birth: 07-21-1970 Referring Provider (PT): Greer Pickerel, MD   Encounter Date: 05/06/2021   PT End of Session - 05/06/21 0757     Visit Number 5    Date for PT Re-Evaluation 06/18/21    Authorization Type BCBS    PT Start Time 1540    PT Stop Time 0839    PT Time Calculation (min) 40 min    Activity Tolerance Patient tolerated treatment well    Behavior During Therapy [facility] for tasks assessed/performed             Past Medical History:  Diagnosis Date   Brain abscess    Head injury, closed, with brief LOC ([place]) 2012   Headache(784.0)    has subdural hemotoma   No pertinent past medical history    Subdural hematoma     Past Surgical History:  Procedure Laterality Date   CRANIOTOMY  03/02/2011   Procedure: CRANIOTOMY HEMATOMA EVACUATION SUBDURAL;  Surgeon: Otilio Connors;  Location: [place] [facility];  Service: Neurosurgery;  Laterality: Left;   CRANIOTOMY  03/31/2011   Procedure: CRANIOTOMY HEMATOMA EVACUATION SUBDURAL;  Surgeon: Otilio Connors;  Location: [place] [facility];  Service: Neurosurgery;  Laterality: Left;  Left Craniectomy for Subdural Abcess   CRANIOTOMY  02/04/2012   Procedure: CRANIOTOMY BONE FLAP/PROSTHETIC PLATE;  Surgeon: Otilio Connors, MD;  Location: [place] [facility];  Service: Neurosurgery;  Laterality: Left;  Cranioplasty with Cranila mesh implant   SHOULDER OPEN ROTATOR CUFF REPAIR     2010  left shoulder    There were no vitals filed for this visit.   Subjective Assessment - 05/06/21 0804     Subjective Pt states it hurts when sitting    Limitations Sitting;Standing    How long can you sit comfortably? 20 min    How long can you stand comfortably? 20 min    Currently in Pain? Yes    Pain Score 5     Pain  Location Pelvis    Pain Orientation Left;Lower    Pain Type Surgical pain                [facility] PT Assessment - 05/06/21 0001       AROM   Left Hip Flexion 70   +pain; can do past 90 but severe pain     Strength   Overall Strength Comments Lt hip abduction and ext 4+/5   limited due to pain     Flexibility   Hamstrings Lt side 60 deg +pain      Palpation   Palpation comment incision in lower abdomen and perineum severely TTP                        Pelvic Floor Special Questions - 05/06/21 0001     External Palpation palpation of perineal incision severely tender to palpation and very guarded with muscle tension throughout LEs and pelvic floor               [facility] Adult PT Treatment/Exercise - 05/06/21 0001       Knee/Hip Exercises: Standing   Walking with Sports Cord backwards walking 25lb      Manual Therapy   Myofascial Release Lt adductors, quads, IT band  PT Short Term Goals - 05/06/21 1610       PT SHORT TERM GOAL #1   Title ind with initial HEP    Status Achieved      PT SHORT TERM GOAL #2   Title Pt will report at least 20% less pain    Baseline 30 % better    Status Achieved      PT SHORT TERM GOAL #3   Title Pt will have hip flexion past 90 deg without pain to improve sitting posture    Baseline at 70 deg starts feeling increased pain    Status Partially Met               PT Long Term Goals - 05/06/21 0844       PT LONG TERM GOAL #1   Title Pt will be ind with advanced HEP for return to work without pain    Status On-going      PT LONG TERM GOAL #2   Title Pt will report at least 75% less pain    Status On-going      PT LONG TERM GOAL #3   Title Pt will demonstarte at least 120 deg hip flexion bil without increased pain    Baseline can get to past 90 with severe pain; 70deg is when pain starts    Status On-going                   Plan - 05/06/21 0849      Clinical Impression Statement Today's session focused on fascial and scar release due to patient still feeling severe pain in the incision around the groin and pt still not able to sit correctly.  This has been causing him to sit on his coccyx and now having more pain in his back.  Pt was educated on using towels or pillow to take pressure off the perineum when sitting. Pt given updates to HEP adding exercises to increase muscle activation on left leg in standing and walking and to build endurance.  Pt is only tolerated sitting and standing still for 20 minutes at a time due to pain.  Pt has high muscle guarding even when palpating and STM to adductors and quads.  Pt did well with MFR and STM today and was able to tolerate pressure and get further into proximal muscle attachments at the end of the session.  Pt is still having pain sitting up straight and with over 70 deg hip flexion.  He will benefit from skilled PT to continue to address impairments for full return to functional activities and physically demanding job.    PT Treatment/Interventions ADLs/Self Care Home Management;Biofeedback;Cryotherapy;Electrical Stimulation;Moist Heat;Therapeutic activities;Therapeutic exercise;Neuromuscular re-education;Patient/family education;Taping;Scar mobilization;Manual techniques;Passive range of motion;Dry needling    PT Next Visit Plan fascial release to adductors and quats and incision as tolerated estim and heat during STM for pain management; follow up on woodpeckers    PT Home Exercise Plan Access Code: [email or web address]    Consulted and Agree with Plan of Care Patient             Patient will benefit from skilled therapeutic intervention in order to improve the following deficits and impairments:  Decreased endurance, Difficulty walking, Increased muscle spasms, Pain, Impaired flexibility, Decreased strength  Visit Diagnosis: Stiffness of left hip, not elsewhere classified  Pain in left hip  Muscle  weakness (generalized)     Problem List Patient Active Problem List   Diagnosis Date Noted   Infection  due to Enterobacter cloacae 05/07/2011   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

## 2021-05-13 ENCOUNTER — Ambulatory Visit: Payer: BC Managed Care – PPO | Admitting: Physical Therapy

## 2021-05-13 ENCOUNTER — Other Ambulatory Visit: Payer: Self-pay

## 2021-05-13 DIAGNOSIS — M25552 Pain in left hip: Secondary | ICD-10-CM

## 2021-05-13 DIAGNOSIS — M25652 Stiffness of left hip, not elsewhere classified: Secondary | ICD-10-CM | POA: Diagnosis not present

## 2021-05-13 DIAGNOSIS — M6281 Muscle weakness (generalized): Secondary | ICD-10-CM

## 2021-05-13 NOTE — Therapy (Signed)
[facility] @ [place], [place], [address] Phone: [phone]   Fax:  [phone]  Physical Therapy Treatment  Patient Details  Name: Don Palmer MRN: [id number] Date of Birth: 1970-09-09 Referring Provider (PT): Greer Pickerel, MD   Encounter Date: 05/13/2021   PT End of Session - 05/13/21 1007     Visit Number 6    Date for PT Re-Evaluation 06/18/21    Authorization Type BCBS    PT Start Time 0800    PT Stop Time 1962    PT Time Calculation (min) 41 min    Activity Tolerance Patient tolerated treatment well    Behavior During Therapy [facility] for tasks assessed/performed             Past Medical History:  Diagnosis Date   Brain abscess    Head injury, closed, with brief LOC ([place]) 2012   Headache(784.0)    has subdural hemotoma   No pertinent past medical history    Subdural hematoma     Past Surgical History:  Procedure Laterality Date   CRANIOTOMY  03/02/2011   Procedure: CRANIOTOMY HEMATOMA EVACUATION SUBDURAL;  Surgeon: Otilio Connors;  Location: [place] [facility];  Service: Neurosurgery;  Laterality: Left;   CRANIOTOMY  03/31/2011   Procedure: CRANIOTOMY HEMATOMA EVACUATION SUBDURAL;  Surgeon: Otilio Connors;  Location: [place] [facility];  Service: Neurosurgery;  Laterality: Left;  Left Craniectomy for Subdural Abcess   CRANIOTOMY  02/04/2012   Procedure: CRANIOTOMY BONE FLAP/PROSTHETIC PLATE;  Surgeon: Otilio Connors, MD;  Location: [place] [facility];  Service: Neurosurgery;  Laterality: Left;  Cranioplasty with Cranila mesh implant   SHOULDER OPEN ROTATOR CUFF REPAIR     2010  left shoulder    There were no vitals filed for this visit.   Subjective Assessment - 05/13/21 0843     Subjective Pt states it felt like stretching it helped out    Limitations Sitting;Standing    Currently in Pain? Yes    Pain Score 4     Pain Location Perineum    Pain Orientation Left;Lower    Pain Descriptors / Indicators  Sore    Pain Type Surgical pain    Pain Onset More than a month ago    Pain Frequency Intermittent    Aggravating Factors  sitting and moving it more    Multiple Pain Sites No                               [facility] Adult PT Treatment/Exercise - 05/13/21 0001       Self-Care   Self-Care Other Self-Care Comments    Other Self-Care Comments  roller to thigh for desensitizing - still very guarded with MFR      Lumbar Exercises: Standing   Other Standing Lumbar Exercises hip hingeing - 20x cues to keep back flat and move at hips      Manual Therapy   Myofascial Release Lt adductors, quads, IT band                       PT Short Term Goals - 05/06/21 0807       PT SHORT TERM GOAL #1   Title ind with initial HEP    Status Achieved      PT SHORT TERM GOAL #2   Title Pt will report at least 20% less pain  Baseline 30 % better    Status Achieved      PT SHORT TERM GOAL #3   Title Pt will have hip flexion past 90 deg without pain to improve sitting posture    Baseline at 70 deg starts feeling increased pain    Status Partially Met               PT Long Term Goals - 05/06/21 0844       PT LONG TERM GOAL #1   Title Pt will be ind with advanced HEP for return to work without pain    Status On-going      PT LONG TERM GOAL #2   Title Pt will report at least 75% less pain    Status On-going      PT LONG TERM GOAL #3   Title Pt will demonstarte at least 120 deg hip flexion bil without increased pain    Baseline can get to past 90 with severe pain; 70deg is when pain starts    Status On-going                   Plan - 05/13/21 0844     Clinical Impression Statement Pt still very tense with MFR in the thigh.  Pt was shown how to do self massge with massage wand.  Pt was educated on hip hingeing and needed to keep back flat.  Pt will benefit from skilled PT to continue to address pain management and function.    PT  Treatment/Interventions ADLs/Self Care Home Management;Biofeedback;Cryotherapy;Electrical Stimulation;Moist Heat;Therapeutic activities;Therapeutic exercise;Neuromuscular re-education;Patient/family education;Taping;Scar mobilization;Manual techniques;Passive range of motion;Dry needling    PT Next Visit Plan fascial release to adductors and quats and incision as tolerated estim and heat during STM for pain management; follow up on woodpeckers    PT Home Exercise Plan Access Code: [email or web address]    Consulted and Agree with Plan of Care Patient             Patient will benefit from skilled therapeutic intervention in order to improve the following deficits and impairments:  Decreased endurance, Difficulty walking, Increased muscle spasms, Pain, Impaired flexibility, Decreased strength  Visit Diagnosis: Stiffness of left hip, not elsewhere classified  Pain in left hip  Muscle weakness (generalized)     Problem List Patient Active Problem List   Diagnosis Date Noted   Infection due to Enterobacter cloacae 05/07/2011   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

## 2021-05-20 ENCOUNTER — Other Ambulatory Visit: Payer: Self-pay

## 2021-05-20 ENCOUNTER — Ambulatory Visit: Payer: BC Managed Care – PPO | Admitting: Physical Therapy

## 2021-05-20 ENCOUNTER — Encounter: Payer: Self-pay | Admitting: Physical Therapy

## 2021-05-20 DIAGNOSIS — M25652 Stiffness of left hip, not elsewhere classified: Secondary | ICD-10-CM | POA: Diagnosis not present

## 2021-05-20 DIAGNOSIS — M6281 Muscle weakness (generalized): Secondary | ICD-10-CM

## 2021-05-20 DIAGNOSIS — M25552 Pain in left hip: Secondary | ICD-10-CM

## 2021-05-20 NOTE — Patient Instructions (Signed)
[email or web address] Code: [email or web address] URL: [email or web address] Date: 05/20/2021 Prepared by: Dwana Curd  Exercises Hip Flexor Stretch with Chair - 1 x daily - 7 x weekly - 1 sets - 3 reps - 30 sec hold Supine Butterfly Groin Stretch - 1 x daily - 7 x weekly - 3 sets - 10 reps Bent Knee Fallouts with Alternating Legs - 1 x daily - 7 x weekly - 2 sets - 10 reps Standing Shoulder Flexion Reactive Isometrics with Elbow Extended - 1 x daily - 7 x weekly - 3 sets - 10 reps Standing Hip Hinge with Dowel - 2 x daily - 7 x weekly - 1 sets - 10 reps  Patient Education Trigger Point Dry Needling

## 2021-05-20 NOTE — Therapy (Signed)
[facility] @ [place], [place], [address] Phone: [phone]   Fax:  [phone]  Physical Therapy Treatment  Patient Details  Name: Don Palmer MRN: [id number] Date of Birth: [birth year] Referring Provider (PT): Greer Pickerel, MD   Encounter Date: 05/20/2021   PT End of Session - 05/20/21 0805     Visit Number 7    Date for PT Re-Evaluation 06/18/21    Authorization Type BCBS    PT Start Time 0800    PT Stop Time 0998    PT Time Calculation (min) 43 min    Activity Tolerance Patient tolerated treatment well    Behavior During Therapy [facility] for tasks assessed/performed             Past Medical History:  Diagnosis Date   Brain abscess    Head injury, closed, with brief LOC ([place]) 2012   Headache(784.0)    has subdural hemotoma   No pertinent past medical history    Subdural hematoma     Past Surgical History:  Procedure Laterality Date   CRANIOTOMY  03/02/2011   Procedure: CRANIOTOMY HEMATOMA EVACUATION SUBDURAL;  Surgeon: Otilio Connors;  Location: [place] [facility];  Service: Neurosurgery;  Laterality: Left;   CRANIOTOMY  03/31/2011   Procedure: CRANIOTOMY HEMATOMA EVACUATION SUBDURAL;  Surgeon: Otilio Connors;  Location: [place] [facility];  Service: Neurosurgery;  Laterality: Left;  Left Craniectomy for Subdural Abcess   CRANIOTOMY  02/04/2012   Procedure: CRANIOTOMY BONE FLAP/PROSTHETIC PLATE;  Surgeon: Otilio Connors, MD;  Location: [place] [facility];  Service: Neurosurgery;  Laterality: Left;  Cranioplasty with Cranila mesh implant   SHOULDER OPEN ROTATOR CUFF REPAIR     2010  left shoulder    There were no vitals filed for this visit.   Subjective Assessment - 05/20/21 0803     Subjective I did a lot yesterday.  I was walking and up and down steps.    Limitations Sitting;Standing    Currently in Pain? Yes    Pain Score 7     Pain Location Perineum    Pain Orientation Left;Lower    Pain Descriptors  / Indicators Sore    Pain Type Surgical pain    Pain Onset More than a month ago    Pain Frequency Intermittent    Multiple Pain Sites No                               [facility] Adult PT Treatment/Exercise - 05/20/21 0001       Self-Care   Other Self-Care Comments  dry needling info and stretch as part of aftercare      Manual Therapy   Manual Therapy Soft tissue mobilization;Myofascial release    Soft tissue mobilization upper trap left side    Myofascial Release lumbar and thoracic spine              Trigger Point Dry Needling - 05/20/21 0001     Consent Given? Yes    Education Handout Provided Yes    Muscles Treated Head and Neck Upper trapezius    Muscles Treated Back/Hip Thoracic multifidi;Lumbar multifidi    Upper Trapezius Response Twitch reponse elicited;Palpable increased muscle length    Lumbar multifidi Response Twitch response elicited    Thoracic multifidi response Twitch response elicited;Palpable increased muscle length  PT Education - 05/20/21 0931     Education Details dry needling info    Person(s) Educated Patient    Methods Explanation;Demonstration;Tactile cues;Verbal cues;Handout    Comprehension Verbalized understanding;Returned demonstration              PT Short Term Goals - 05/06/21 0807       PT SHORT TERM GOAL #1   Title ind with initial HEP    Status Achieved      PT SHORT TERM GOAL #2   Title Pt will report at least 20% less pain    Baseline 30 % better    Status Achieved      PT SHORT TERM GOAL #3   Title Pt will have hip flexion past 90 deg without pain to improve sitting posture    Baseline at 70 deg starts feeling increased pain    Status Partially Met               PT Long Term Goals - 05/06/21 0844       PT LONG TERM GOAL #1   Title Pt will be ind with advanced HEP for return to work without pain    Status On-going      PT LONG TERM GOAL #2   Title Pt will  report at least 75% less pain    Status On-going      PT LONG TERM GOAL #3   Title Pt will demonstarte at least 120 deg hip flexion bil without increased pain    Baseline can get to past 90 with severe pain; 70deg is when pain starts    Status On-going                   Plan - 05/20/21 0933     Clinical Impression Statement Pt responded really well to MFR with dry needling.  Pt had instant release of muscle tension with dry needling techniques added to MFR.  Pt was educated on aftercare and stretches to do to maintain improved soft tissue length    PT Treatment/Interventions ADLs/Self Care Home Management;Biofeedback;Cryotherapy;Electrical Stimulation;Moist Heat;Therapeutic activities;Therapeutic exercise;Neuromuscular re-education;Patient/family education;Taping;Scar mobilization;Manual techniques;Passive range of motion;Dry needling    PT Next Visit Plan f/u on DN throughout spine and response to overall pain, DN#1 to all multifidi that are tight, upper trap on the left side    PT Home Exercise Plan Access Code: [email or web address]    Consulted and Agree with Plan of Care Patient             Patient will benefit from skilled therapeutic intervention in order to improve the following deficits and impairments:  Decreased endurance, Difficulty walking, Increased muscle spasms, Pain, Impaired flexibility, Decreased strength  Visit Diagnosis: Stiffness of left hip, not elsewhere classified  Pain in left hip  Muscle weakness (generalized)     Problem List Patient Active Problem List   Diagnosis Date Noted   Infection due to Enterobacter cloacae 05/07/2011   Brain abscess 05/06/2011   Subdural hematoma 05/06/2011   Expressive aphasia 05/06/2011    Jule Ser, PT 05/20/2021, 9:45 AM  [facility] @ [place], [place], [address] Phone: [phone]   Fax:  [phone]  Name: CLELAND SIMKINS MRN:  [id number] Date of Birth: 17-Sep-1970

## 2021-05-27 ENCOUNTER — Ambulatory Visit: Payer: BC Managed Care – PPO | Attending: General Surgery | Admitting: Physical Therapy

## 2021-05-27 ENCOUNTER — Encounter: Payer: Self-pay | Admitting: Physical Therapy

## 2021-05-27 ENCOUNTER — Other Ambulatory Visit: Payer: Self-pay

## 2021-05-27 DIAGNOSIS — M6281 Muscle weakness (generalized): Secondary | ICD-10-CM | POA: Insufficient documentation

## 2021-05-27 DIAGNOSIS — M25652 Stiffness of left hip, not elsewhere classified: Secondary | ICD-10-CM | POA: Diagnosis not present

## 2021-05-27 DIAGNOSIS — M25552 Pain in left hip: Secondary | ICD-10-CM | POA: Insufficient documentation

## 2021-05-27 NOTE — Therapy (Signed)
[facility] @ [place], [place], [address] Phone: [phone]   Fax:  [phone]  Physical Therapy Treatment  Patient Details  Name: Don Palmer MRN: [id number] Date of Birth: 1971/02/02 Referring Provider (PT): Greer Pickerel, MD   Encounter Date: 05/27/2021   PT End of Session - 05/27/21 0758     Visit Number 8    Date for PT Re-Evaluation 06/18/21    Authorization Type BCBS    PT Start Time 0758    PT Stop Time 0845    PT Time Calculation (min) 47 min    Activity Tolerance Patient tolerated treatment well    Behavior During Therapy [facility] for tasks assessed/performed             Past Medical History:  Diagnosis Date   Brain abscess    Head injury, closed, with brief LOC ([place]) 2012   Headache(784.0)    has subdural hemotoma   No pertinent past medical history    Subdural hematoma     Past Surgical History:  Procedure Laterality Date   CRANIOTOMY  03/02/2011   Procedure: CRANIOTOMY HEMATOMA EVACUATION SUBDURAL;  Surgeon: Otilio Connors;  Location: [place] [facility];  Service: Neurosurgery;  Laterality: Left;   CRANIOTOMY  03/31/2011   Procedure: CRANIOTOMY HEMATOMA EVACUATION SUBDURAL;  Surgeon: Otilio Connors;  Location: [place] [facility];  Service: Neurosurgery;  Laterality: Left;  Left Craniectomy for Subdural Abcess   CRANIOTOMY  02/04/2012   Procedure: CRANIOTOMY BONE FLAP/PROSTHETIC PLATE;  Surgeon: Otilio Connors, MD;  Location: [place] [facility];  Service: Neurosurgery;  Laterality: Left;  Cranioplasty with Cranila mesh implant   SHOULDER OPEN ROTATOR CUFF REPAIR     2010  left shoulder    There were no vitals filed for this visit.   Subjective Assessment - 05/27/21 0808     Subjective It just hurts right in the front lower quadrant.  My back is a lot better so I was able to sleep on my back    Currently in Pain? Yes    Pain Score 5     Pain Location Abdomen    Pain Orientation Left;Lower    Pain  Descriptors / Indicators Sore    Pain Type Surgical pain    Pain Onset More than a month ago    Pain Frequency Intermittent    Multiple Pain Sites No                               [facility] Adult PT Treatment/Exercise - 05/27/21 0001       Self-Care   Other Self-Care Comments  TENS info      Modalities   Modalities Teacher, English as a foreign language Location lumbar    Electrical Stimulation Action IFC    Electrical Stimulation Parameters to tolerance    Electrical Stimulation Goals Pain      Manual Therapy   Manual Therapy Soft tissue mobilization    Soft tissue mobilization upper trap bil, lumbar and thoracic paraspinals              Trigger Point Dry Needling - 05/27/21 0001     Consent Given? Yes    Education Handout Provided Previously provided    Dry Needling Comments bil    Upper Trapezius Response Palpable increased muscle length;Twitch reponse elicited    Lumbar multifidi Response Twitch  response elicited;Palpable increased muscle length    Thoracic multifidi response Twitch response elicited;Palpable increased muscle length                   PT Education - 05/27/21 1006     Education Details TENS info and cat cow stretch added    Person(s) Educated Patient    Methods Explanation;Demonstration;Handout    Comprehension Verbalized understanding;Returned demonstration              PT Short Term Goals - 05/06/21 0807       PT SHORT TERM GOAL #1   Title ind with initial HEP    Status Achieved      PT SHORT TERM GOAL #2   Title Pt will report at least 20% less pain    Baseline 30 % better    Status Achieved      PT SHORT TERM GOAL #3   Title Pt will have hip flexion past 90 deg without pain to improve sitting posture    Baseline at 70 deg starts feeling increased pain    Status Partially Met               PT Long Term Goals - 05/27/21 1011       PT LONG TERM GOAL #1    Title Pt will be ind with advanced HEP for return to work without pain    Status On-going      PT LONG TERM GOAL #2   Title Pt will report at least 75% less pain    Baseline some improvement this week since address the tension in his back    Status On-going      PT LONG TERM GOAL #3   Title Pt will demonstarte at least 120 deg hip flexion bil without increased pain    Baseline can get to past 90 with severe pain; 70deg is when pain starts    Status On-going      PT LONG TERM GOAL #4   Title Pt will be able to sit and stand for at least 4 hours to perform job related tasks without increased pain    Status On-going      PT LONG TERM GOAL #5   Title Pt will demonstrate squat and be able to lift at least 20 lb with good technique for return to normal activities    Status On-going                   Plan - 05/27/21 1008     Clinical Impression Statement Pt did well with DN#2 and felt releases and twitch response throughout. TENS after treatment for pain management and for demo so patient can understand what home unit can do.  Pt has been doing much better this week with dry needling and will benefit from skilled PT to continue to work on muscle spasms.    PT Treatment/Interventions ADLs/Self Care Home Management;Biofeedback;Cryotherapy;Electrical Stimulation;Moist Heat;Therapeutic activities;Therapeutic exercise;Neuromuscular re-education;Patient/family education;Taping;Scar mobilization;Manual techniques;Passive range of motion;Dry needling    PT Next Visit Plan f/u on DN and hip flexor; cat cow    PT Home Exercise Plan Access Code: [email or web address]    Consulted and Agree with Plan of Care Patient             Patient will benefit from skilled therapeutic intervention in order to improve the following deficits and impairments:  Decreased endurance, Difficulty walking, Increased muscle spasms, Pain, Impaired flexibility, Decreased strength  Visit Diagnosis: Stiffness of left  hip,  not elsewhere classified  Pain in left hip  Muscle weakness (generalized)     Problem List Patient Active Problem List   Diagnosis Date Noted   Infection due to Enterobacter cloacae 05/07/2011   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

## 2021-06-04 ENCOUNTER — Other Ambulatory Visit: Payer: Self-pay

## 2021-06-04 ENCOUNTER — Ambulatory Visit: Payer: BC Managed Care – PPO | Admitting: Physical Therapy

## 2021-06-04 DIAGNOSIS — M25552 Pain in left hip: Secondary | ICD-10-CM

## 2021-06-04 DIAGNOSIS — M25652 Stiffness of left hip, not elsewhere classified: Secondary | ICD-10-CM

## 2021-06-04 DIAGNOSIS — M6281 Muscle weakness (generalized): Secondary | ICD-10-CM

## 2021-06-04 NOTE — Therapy (Signed)
[facility] @ [place], [place], [address] Phone: [phone]   Fax:  [phone]  Physical Therapy Treatment  Patient Details  Name: Don Palmer MRN: [id number] Date of Birth: 1970/06/15 Referring Provider (PT): Greer Pickerel, MD   Encounter Date: 06/04/2021   PT End of Session - 06/04/21 0942     Visit Number 9    Date for PT Re-Evaluation 06/18/21    Authorization Type BCBS    PT Start Time 0932    PT Stop Time 1012    PT Time Calculation (min) 40 min    Activity Tolerance Patient tolerated treatment well    Behavior During Therapy [facility] for tasks assessed/performed             Past Medical History:  Diagnosis Date   Brain abscess    Head injury, closed, with brief LOC ([place]) 2012   Headache(784.0)    has subdural hemotoma   No pertinent past medical history    Subdural hematoma     Past Surgical History:  Procedure Laterality Date   CRANIOTOMY  03/02/2011   Procedure: CRANIOTOMY HEMATOMA EVACUATION SUBDURAL;  Surgeon: Otilio Connors;  Location: [place] [facility];  Service: Neurosurgery;  Laterality: Left;   CRANIOTOMY  03/31/2011   Procedure: CRANIOTOMY HEMATOMA EVACUATION SUBDURAL;  Surgeon: Otilio Connors;  Location: [place] [facility];  Service: Neurosurgery;  Laterality: Left;  Left Craniectomy for Subdural Abcess   CRANIOTOMY  02/04/2012   Procedure: CRANIOTOMY BONE FLAP/PROSTHETIC PLATE;  Surgeon: Otilio Connors, MD;  Location: [place] [facility];  Service: Neurosurgery;  Laterality: Left;  Cranioplasty with Cranila mesh implant   SHOULDER OPEN ROTATOR CUFF REPAIR     2010  left shoulder    There were no vitals filed for this visit.   Subjective Assessment - 06/04/21 0947     Subjective I feel it is sore after we do the stretches for a couple days and then it eases up and feels better.  I am not lifting anything.  I am still worried about damaging things.    Currently in Pain? Yes    Pain Score 6     when I am stretching it is a 6   Pain Location Abdomen    Pain Orientation Left    Pain Descriptors / Indicators Sore    Pain Type Surgical pain    Pain Radiating Towards down to the groin    Pain Onset More than a month ago    Pain Frequency Intermittent    Aggravating Factors  moving and walking a lot    Multiple Pain Sites No                               [facility] Adult PT Treatment/Exercise - 06/04/21 0001       Neuro Re-ed    Neuro Re-ed Details  diaphragmatic breathing -      Lumbar Exercises: Stretches   Other Lumbar Stretch Exercise hip rotation with press; hip rotation add thoracic rotation      Lumbar Exercises: Supine   Other Supine Lumbar Exercises core and TrA engaged and arms overhead cues to breathe      Manual Therapy   Soft tissue mobilization bil thoracic and lumbar paraspinals              Trigger Point Dry Needling - 06/04/21 0001     Consent Given?  Yes    Education Handout Provided Previously provided    Lumbar multifidi Response Twitch response elicited;Palpable increased muscle length    Thoracic multifidi response Twitch response elicited;Palpable increased muscle length                     PT Short Term Goals - 05/06/21 0807       PT SHORT TERM GOAL #1   Title ind with initial HEP    Status Achieved      PT SHORT TERM GOAL #2   Title Pt will report at least 20% less pain    Baseline 30 % better    Status Achieved      PT SHORT TERM GOAL #3   Title Pt will have hip flexion past 90 deg without pain to improve sitting posture    Baseline at 70 deg starts feeling increased pain    Status Partially Met               PT Long Term Goals - 06/04/21 1211       PT LONG TERM GOAL #1   Title Pt will be ind with advanced HEP for return to work without pain    Status On-going      PT LONG TERM GOAL #2   Title Pt will report at least 75% less pain    Baseline some improvement this week since address the  tension in his back    Status On-going      PT LONG TERM GOAL #3   Title Pt will demonstarte at least 120 deg hip flexion bil without increased pain    Baseline can get to past 90 with moderate pain    Status On-going      PT LONG TERM GOAL #4   Title Pt will be able to sit and stand for at least 4 hours to perform job related tasks without increased pain    Status On-going      PT LONG TERM GOAL #5   Title Pt will demonstrate squat and be able to lift at least 20 lb with good technique for return to normal activities    Baseline not able to lift weight due to pain and worried about damage to surgery    Status On-going                   Plan - 06/04/21 1206     Clinical Impression Statement Pt reports better pain management and ability to move more a couple days after the previous treatment.  Pt has purchased a TENS unit and reports that has helped his back a lot.  Pt was able to tolerate more hip rotation doing IR/ER exercise today.  Pt had good release in thoracic spine with STM and DN techniques. Pt will benefit from skilled PT to continue working on soft tissue length and mobility for improved functional movements without pain and return to being able to lift without pain as well.    PT Treatment/Interventions ADLs/Self Care Home Management;Biofeedback;Cryotherapy;Electrical Stimulation;Moist Heat;Therapeutic activities;Therapeutic exercise;Neuromuscular re-education;Patient/family education;Taping;Scar mobilization;Manual techniques;Passive range of motion;Dry needling    PT Next Visit Plan DN to lumbar, thoracic, hip flexors, thoracic rotation with breathing, core strength in supine    PT Home Exercise Plan Access Code: [email or web address]    Consulted and Agree with Plan of Care Patient             Patient will benefit from skilled therapeutic intervention in order to improve  the following deficits and impairments:  Decreased endurance, Difficulty walking, Increased muscle  spasms, Pain, Impaired flexibility, Decreased strength  Visit Diagnosis: Stiffness of left hip, not elsewhere classified  Pain in left hip  Muscle weakness (generalized)     Problem List Patient Active Problem List   Diagnosis Date Noted   Infection due to Enterobacter cloacae 05/07/2011   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

## 2021-06-12 ENCOUNTER — Encounter: Payer: Self-pay | Admitting: Physical Therapy

## 2021-06-12 ENCOUNTER — Ambulatory Visit: Payer: BC Managed Care – PPO | Admitting: Physical Therapy

## 2021-06-12 ENCOUNTER — Other Ambulatory Visit: Payer: Self-pay

## 2021-06-12 DIAGNOSIS — M6281 Muscle weakness (generalized): Secondary | ICD-10-CM

## 2021-06-12 DIAGNOSIS — M25652 Stiffness of left hip, not elsewhere classified: Secondary | ICD-10-CM | POA: Diagnosis not present

## 2021-06-12 DIAGNOSIS — M25552 Pain in left hip: Secondary | ICD-10-CM
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
## 2021-06-19 ENCOUNTER — Ambulatory Visit: Payer: BC Managed Care – PPO | Attending: General Surgery | Admitting: Physical Therapy

## 2021-06-19 ENCOUNTER — Encounter: Payer: Self-pay | Admitting: Physical Therapy

## 2021-06-19 ENCOUNTER — Other Ambulatory Visit: Payer: Self-pay

## 2021-06-19 DIAGNOSIS — M6281 Muscle weakness (generalized): Secondary | ICD-10-CM | POA: Insufficient documentation

## 2021-06-19 DIAGNOSIS — M25652 Stiffness of left hip, not elsewhere classified: Secondary | ICD-10-CM | POA: Diagnosis not present

## 2021-06-19 DIAGNOSIS — M25552 Pain in left hip: Secondary | ICD-10-CM | POA: Insufficient documentation

## 2021-06-19 NOTE — Therapy (Signed)
[facility] ?[facility] [place] @ [place] ?[place], [place], [address] ?Phone: [phone]   Fax:  [phone] ? ?Physical Therapy Treatment ? ?Patient Details  ?Name: Don Palmer ?MRN: [id number] ?Date of Birth: April 29, 1970 ?Referring Provider (PT): Greer Pickerel, MD ? ? ?Encounter Date: 06/19/2021 ? ? PT End of Session - 06/19/21 0846   ? ? Visit Number 11   ? Date for PT Re-Evaluation 09/04/21   ? Authorization Type BCBS   ? PT Start Time [phone]   ? PT Stop Time 3875   ? PT Time Calculation (min) 43 min   ? Activity Tolerance Patient tolerated treatment well   ? Behavior During Therapy [facility] for tasks assessed/performed   ? ?  ?  ? ?  ? ? ?Past Medical History:  ?Diagnosis Date  ? Brain abscess   ? Head injury, closed, with brief LOC ([place]) 2012  ? Headache(784.0)   ? has subdural hemotoma  ? No pertinent past medical history   ? Subdural hematoma   ? ? ?Past Surgical History:  ?Procedure Laterality Date  ? CRANIOTOMY  03/02/2011  ? Procedure: CRANIOTOMY HEMATOMA EVACUATION SUBDURAL;  Surgeon: Otilio Connors;  Location: [place] [facility];  Service: Neurosurgery;  Laterality: Left;  ? CRANIOTOMY  03/31/2011  ? Procedure: CRANIOTOMY HEMATOMA EVACUATION SUBDURAL;  Surgeon: Otilio Connors;  Location: [place] [facility];  Service: Neurosurgery;  Laterality: Left;  Left Craniectomy for Subdural Abcess  ? CRANIOTOMY  02/04/2012  ? Procedure: CRANIOTOMY BONE FLAP/PROSTHETIC PLATE;  Surgeon: Otilio Connors, MD;  Location: [place] [facility];  Service: Neurosurgery;  Laterality: Left;  Cranioplasty with Cranila mesh implant  ? SHOULDER OPEN ROTATOR CUFF REPAIR    ? 2010  left shoulder  ? ? ?There were no vitals filed for this visit. ? ? Subjective Assessment - 06/19/21 0847   ? ? Subjective Pt sates feeling better but still the lower abdomen is worse   ? Currently in Pain? Yes   ? Pain Score 4    4.5  ? Pain Location Abdomen   ? Pain Orientation Left   ? Pain Descriptors / Indicators Sore   ? Pain Type  Surgical pain   ? Pain Onset More than a month ago   ? Pain Frequency Intermittent   ? Multiple Pain Sites No   ? ?  ?  ? ?  ? ? ? ? ? ? ? ? ? ? ? ? ? ? ? ? ? ? ? ? [facility] Adult PT Treatment/Exercise - 06/19/21 0001   ? ?  ? Self-Care  ? Other Self-Care Comments  scar massage when using stim   ?  ? Manual Therapy  ? Soft tissue mobilization thoracic and lumbar paraspinals adductors and Lt gltueals   ? Myofascial Release release around abdominal scar tissue   ? ?  ?  ? ?  ? ? ? Trigger Point Dry Needling - 06/19/21 0001   ? ? Consent Given? Yes   ? Education Handout Provided Previously provided   ? Adductor Response Twitch response elicited;Palpable increased muscle length   ? Gluteus Minimus Response Twitch response elicited;Palpable increased muscle length   ? Tensor Fascia Lata Response Twitch response elicited;Palpable increased muscle length   ? Lumbar multifidi Response Twitch response elicited;Palpable increased muscle length   ? Thoracic multifidi response Twitch response elicited;Palpable increased muscle length   ? ?  ?  ? ?  ? ? ? ? ? ? ? ? ? ?  PT Short Term Goals - 05/06/21 0807   ? ?  ? PT SHORT TERM GOAL #1  ? Title ind with initial HEP   ? Status Achieved   ?  ? PT SHORT TERM GOAL #2  ? Title Pt will report at least 20% less pain   ? Baseline 30 % better   ? Status Achieved   ?  ? PT SHORT TERM GOAL #3  ? Title Pt will have hip flexion past 90 deg without pain to improve sitting posture   ? Baseline at 70 deg starts feeling increased pain   ? Status Partially Met   ? ?  ?  ? ?  ? ? ? ? PT Long Term Goals - 06/12/21 1208   ? ?  ? PT LONG TERM GOAL #1  ? Title Pt will be ind with advanced HEP for return to work without pain   ? Baseline still learning, not yet to advanced exercises due to pain   ? Time 12   ? Period Weeks   ? Status On-going   ? Target Date 09/04/21   ?  ? PT LONG TERM GOAL #2  ? Title Pt will report at least 75% less pain   ? Baseline pain is better and tolerated pressure to incision  sites now during STM   ? Time 12   ? Period Weeks   ? Status On-going   ? Target Date 09/04/21   ?  ? PT LONG TERM GOAL #3  ? Title Pt will demonstarte at least 120 deg hip flexion bil without increased pain   ? Baseline can get to past 95 with moderate pain   ? Time 12   ? Period Weeks   ? Status On-going   ? Target Date 09/04/21   ?  ? PT LONG TERM GOAL #4  ? Title Pt will be able to sit and stand for at least 4 hours to perform job related tasks without increased pain   ? Time 12   ? Period Weeks   ? Status On-going   ? Target Date 09/04/21   ?  ? PT LONG TERM GOAL #5  ? Title Pt will demonstrate squat and be able to lift at least 20 lb with good technique for return to normal activities   ? Baseline not lifting weight due to pain   ? Time 12   ? Period Weeks   ? Status On-going   ? Target Date 09/04/21   ? ?  ?  ? ?  ? ? ? ? ? ? ? ? Plan - 06/19/21 1259   ? ? Clinical Impression Statement Pt was more tender today that previous visit but he reports overall he feels much better after the dry needling.  PT session focused on soft tissue release and dry needling for pain management.  He was given instructions to do some scar tissue massage to lower abdomen as well.  Pt will benefit from skilled PT to continue working on strength and pain management   ? PT Treatment/Interventions ADLs/Self Care Home Management;Biofeedback;Cryotherapy;Electrical Stimulation;Moist Heat;Therapeutic activities;Therapeutic exercise;Neuromuscular re-education;Patient/family education;Taping;Scar mobilization;Manual techniques;Passive range of motion;Dry needling   ? PT Next Visit Plan DN to adductors and glutes Lt side, lumbar, thoracic, hip flexors, continue thoracic rotation with OP, core strength in supine   ? PT Home Exercise Plan Access Code: [email or web address]   ? Consulted and Agree with Plan of Care Patient   ? ?  ?  ? ?  ? ? ?  Patient will benefit from skilled therapeutic intervention in order to improve the following deficits and  impairments:  Decreased endurance, Difficulty walking, Increased muscle spasms, Pain, Impaired flexibility, Decreased strength ? ?Visit Diagnosis: ?Stiffness of left hip, not elsewhere classified ? ?Pain in left hip ? ?Muscle weakness (generalized) ? ? ? ? ?Problem List ?Patient Active Problem List  ? Diagnosis Date Noted  ? Infection due to Enterobacter cloacae 05/07/2011  ? 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 Subdural hematoma 05/06/2011  ? Expressive aphasia 05/06/2011  ? ? ?Jule Ser, PT ?06/19/2021, 1:10 PM ? ?[facility] [employer] ?[facility] [place] ?[place], [place], [address] ?Phone: [phone]   Fax:  [phone] ? ?Name: Don Palmer ?MRN: [id number] ?Date of Birth: [birth year] ? ? ? ?

## 2021-06-26 ENCOUNTER — Other Ambulatory Visit: Payer: Self-pay

## 2021-06-26 ENCOUNTER — Encounter: Payer: Self-pay | Admitting: Physical Therapy

## 2021-06-26 ENCOUNTER — Ambulatory Visit: Payer: BC Managed Care – PPO | Admitting: Physical Therapy

## 2021-06-26 DIAGNOSIS — M25652 Stiffness of left hip, not elsewhere classified: Secondary | ICD-10-CM

## 2021-06-26 DIAGNOSIS — M6281 Muscle weakness (generalized): Secondary | ICD-10-CM

## 2021-06-26 DIAGNOSIS — M25552 Pain in left hip: Secondary | ICD-10-CM

## 2021-06-26 NOTE — Therapy (Signed)
[facility] ?[facility] @ [place] ?[place], [place], [address] ?Phone: [phone]   Fax:  [phone] ? ?Physical Therapy Treatment ? ?Patient Details  ?Name: Don Palmer ?MRN: [id number] ?Date of Birth: 1970-05-02 ?Referring Provider (PT): Greer Pickerel, MD ? ? ?Encounter Date: 06/26/2021 ? ? PT End of Session - 06/26/21 0848   ? ? Visit Number 12   ? Date for PT Re-Evaluation 09/04/21   ? Authorization Type BCBS   ? PT Start Time [phone]   ? PT Stop Time 0926   ? PT Time Calculation (min) 40 min   ? Activity Tolerance Patient tolerated treatment well   ? Behavior During Therapy [facility] for tasks assessed/performed   ? ?  ?  ? ?  ? ? ?Past Medical History:  ?Diagnosis Date  ? Brain abscess   ? Head injury, closed, with brief LOC ([place]) 2012  ? Headache(784.0)   ? has subdural hemotoma  ? No pertinent past medical history   ? Subdural hematoma   ? ? ?Past Surgical History:  ?Procedure Laterality Date  ? CRANIOTOMY  03/02/2011  ? Procedure: CRANIOTOMY HEMATOMA EVACUATION SUBDURAL;  Surgeon: Otilio Connors;  Location: [place] [facility];  Service: Neurosurgery;  Laterality: Left;  ? CRANIOTOMY  03/31/2011  ? Procedure: CRANIOTOMY HEMATOMA EVACUATION SUBDURAL;  Surgeon: Otilio Connors;  Location: [place] [facility];  Service: Neurosurgery;  Laterality: Left;  Left Craniectomy for Subdural Abcess  ? CRANIOTOMY  02/04/2012  ? Procedure: CRANIOTOMY BONE FLAP/PROSTHETIC PLATE;  Surgeon: Otilio Connors, MD;  Location: [place] [facility];  Service: Neurosurgery;  Laterality: Left;  Cranioplasty with Cranila mesh implant  ? SHOULDER OPEN ROTATOR CUFF REPAIR    ? 2010  left shoulder  ? ? ?There were no vitals filed for this visit. ? ? Subjective Assessment - 06/26/21 0938   ? ? Subjective Pt states he is feeling much better since last session.  He says he can sit and move around a lot more than he was able to before.  Pt was asked how long and unable to give a number but states he can tell it is more.    ? Currently in Pain? Yes   ? Pain Score 2    ? Pain Location Abdomen   ? Pain Orientation Left   ? ?  ?  ? ?  ? ? ? ? ? ? ? ? ? ? ? ? ? ? ? ? ? ? ? ? [facility] Adult PT Treatment/Exercise - 06/26/21 0001   ? ?  ? Lumbar Exercises: Standing  ? Other Standing Lumbar Exercises pallof press; squat and press, half kneel and diagonals   ? Other Standing Lumbar Exercises quad stretch prone   ?  ? Manual Therapy  ? Soft tissue mobilization thoracic and lumbar paraspinals adductors and Lt gltueals   ? ?  ?  ? ?  ? ? ? Trigger Point Dry Needling - 06/26/21 0001   ? ? Consent Given? Yes   ? Education Handout Provided Previously provided   ? Muscles Treated Back/Hip Iliopsoas   ? Gluteus Minimus Response Twitch response elicited;Palpable increased muscle length   ? Tensor Fascia Lata Response Twitch response elicited;Palpable increased muscle length   ? Lumbar multifidi Response Twitch response elicited;Palpable increased muscle length   ? Iliopsoas Response Twitch response elicited;Palpable increased muscle length   ? Thoracic multifidi response Twitch response elicited;Palpable increased muscle length   ? ?  ?  ? ?  ? ? ? ? ? ? ? ?  PT Education - 06/26/21 0940   ? ? Education Details Access Code: [email or web address]   ? Person(s) Educated Patient   ? Methods Explanation;Demonstration;Tactile cues;Verbal cues;Handout   ? Comprehension Verbalized understanding;Returned demonstration   ? ?  ?  ? ?  ? ? ? PT Short Term Goals - 05/06/21 0807   ? ?  ? PT SHORT TERM GOAL #1  ? Title ind with initial HEP   ? Status Achieved   ?  ? PT SHORT TERM GOAL #2  ? Title Pt will report at least 20% less pain   ? Baseline 30 % better   ? Status Achieved   ?  ? PT SHORT TERM GOAL #3  ? Title Pt will have hip flexion past 90 deg without pain to improve sitting posture   ? Baseline at 70 deg starts feeling increased pain   ? Status Partially Met   ? ?  ?  ? ?  ? ? ? ? PT Long Term Goals - 06/12/21 1208   ? ?  ? PT LONG TERM GOAL #1  ? Title Pt will be ind with  advanced HEP for return to work without pain   ? Baseline still learning, not yet to advanced exercises due to pain   ? Time 12   ? Period Weeks   ? Status On-going   ? Target Date 09/04/21   ?  ? PT LONG TERM GOAL #2  ? Title Pt will report at least 75% less pain   ? Baseline pain is better and tolerated pressure to incision sites now during STM   ? Time 12   ? Period Weeks   ? Status On-going   ? Target Date 09/04/21   ?  ? PT LONG TERM GOAL #3  ? Title Pt will demonstarte at least 120 deg hip flexion bil without increased pain   ? Baseline can get to past 95 with moderate pain   ? Time 12   ? Period Weeks   ? Status On-going   ? Target Date 09/04/21   ?  ? PT LONG TERM GOAL #4  ? Title Pt will be able to sit and stand for at least 4 hours to perform job related tasks without increased pain   ? Time 12   ? Period Weeks   ? Status On-going   ? Target Date 09/04/21   ?  ? PT LONG TERM GOAL #5  ? Title Pt will demonstrate squat and be able to lift at least 20 lb with good technique for return to normal activities   ? Baseline not lifting weight due to pain   ? Time 12   ? Period Weeks   ? Status On-going   ? Target Date 09/04/21   ? ?  ?  ? ?  ? ? ? ? ? ? ? ? Plan - 06/26/21 0933   ? ? Clinical Impression Statement Pt reported that he is doing much better today.  Pt was able to add some exercises and education on doing them throughout the day multiple times per day since he is planning on going back to work for half days.  Pt is still very TTP throughout his trunk and unable to tolerate deep pressure with STM.  Pt is still responding well to dry needles.  He will benefit from skilled PT to progress into more strengthening for safely returning to work.   ? PT Treatment/Interventions ADLs/Self Care Home Management;Biofeedback;Cryotherapy;Electrical Stimulation;Moist Heat;Therapeutic activities;Therapeutic  exercise;Neuromuscular re-education;Patient/family education;Taping;Scar mobilization;Manual techniques;Passive  range of motion;Dry needling   ? PT Next Visit Plan start with nustep or stepups; focus on core strength, DN adductors   ? PT Home Exercise Plan Access Code: [email or web address]   ? ?  ?  ? ?  ? ? ?Patient will benefit from skilled therapeutic intervention in order to improve the following deficits and impairments:  Decreased endurance, Difficulty walking, Increased muscle spasms, Pain, Impaired flexibility, Decreased strength ? ?Visit Diagnosis: ?Stiffness of left hip, not elsewhere classified ? ?Pain in left hip ? ?Muscle weakness (generalized) ? ? ? ? ?Problem List ?Patient Active Problem List  ? Diagnosis Date Noted  ? Infection due to Enterobacter cloacae 05/07/2011  ? 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 Subdural hematoma 05/06/2011  ? Expressive aphasia 05/06/2011  ? ? ?Jule Ser, PT ?06/26/2021, 9:40 AM ? ?[facility] ?[facility] [place] ?[place], [place], [address] ?Phone: [phone]   Fax:  [phone] ? ?Name: MARQ REBELLO ?MRN: [id number] ?Date of Birth: 03/03/1971 ? ? ? ?

## 2021-06-26 NOTE — Patient Instructions (Signed)
Access Code: [email or web address] ?URL: [email or web address] ?Date: 06/26/2021 ?Prepared by: Dwana Curd ? ?Exercises ?Hip Flexor Stretch with Chair - 1 x daily - 7 x weekly - 1 sets - 3 reps - 30 sec hold ?Supine Butterfly Groin Stretch - 1 x daily - 7 x weekly - 3 sets - 10 reps ?Bent Knee Fallouts with Alternating Legs - 1 x daily - 7 x weekly - 2 sets - 10 reps ?Standing Shoulder Flexion Reactive Isometrics with Elbow Extended - 1 x daily - 7 x weekly - 3 sets - 10 reps ?Standing Hip Hinge with Dowel - 2 x daily - 7 x weekly - 1 sets - 10 reps ?Cat Cow - 1 x daily - 7 x weekly - 3 sets - 10 reps ?Supine Shoulder Overhead Flexion with Medicine Ball - 1 x daily - 7 x weekly - 3 sets - 10 reps ?Squatting Anti-Rotation Press - 1 x daily - 7 x weekly - 3 sets - 10 reps ?Half Kneeling Diagonal Lift with Narrow Balance - 1 x daily - 7 x weekly - 3 sets - 10 reps ? ?Patient Education ?Trigger Point Dry Needling ?

## 2021-07-03 ENCOUNTER — Encounter: Payer: Self-pay | Admitting: Physical Therapy

## 2021-07-03 ENCOUNTER — Other Ambulatory Visit: Payer: Self-pay

## 2021-07-03 ENCOUNTER — Ambulatory Visit: Payer: BC Managed Care – PPO | Admitting: Physical Therapy

## 2021-07-03 DIAGNOSIS — M25652 Stiffness of left hip, not elsewhere classified: Secondary | ICD-10-CM | POA: Diagnosis not present

## 2021-07-03 NOTE — Therapy (Signed)
[place] ?[facility] ?[place], [place], [address] ?Phone: [phone]   Fax:  [phone] ? ?Physical Therapy Treatment ? ?Patient Details  ?Name: Don Palmer ?MRN: [id number] ?Date of Birth: Jun 23, 1970 ?Referring Provider (PT): Greer Pickerel, MD ? ? ?Encounter Date: 07/03/2021 ? ? PT End of Session - 07/03/21 0855   ? ? Visit Number 13   ? Date for PT Re-Evaluation 09/04/21   ? Authorization Type BCBS   ? PT Start Time [phone]   ? PT Stop Time [phone]   ? PT Time Calculation (min) 38 min   ? Activity Tolerance Patient tolerated treatment well   ? Behavior During Therapy [facility] for tasks assessed/performed   ? ?  ?  ? ?  ? ? ?Past Medical History:  ?Diagnosis Date  ? Brain abscess   ? Head injury, closed, with brief LOC ([place]) 2012  ? Headache(784.0)   ? has subdural hemotoma  ? No pertinent past medical history   ? Subdural hematoma   ? ? ?Past Surgical History:  ?Procedure Laterality Date  ? CRANIOTOMY  03/02/2011  ? Procedure: CRANIOTOMY HEMATOMA EVACUATION SUBDURAL;  Surgeon: Otilio Connors;  Location: [place] [facility];  Service: Neurosurgery;  Laterality: Left;  ? CRANIOTOMY  03/31/2011  ? Procedure: CRANIOTOMY HEMATOMA EVACUATION SUBDURAL;  Surgeon: Otilio Connors;  Location: [place] [facility];  Service: Neurosurgery;  Laterality: Left;  Left Craniectomy for Subdural Abcess  ? CRANIOTOMY  02/04/2012  ? Procedure: CRANIOTOMY BONE FLAP/PROSTHETIC PLATE;  Surgeon: Otilio Connors, MD;  Location: [place] [facility];  Service: Neurosurgery;  Laterality: Left;  Cranioplasty with Cranila mesh implant  ? SHOULDER OPEN ROTATOR CUFF REPAIR    ? 2010  left shoulder  ? ? ?There were no vitals filed for this visit. ? ? Subjective Assessment - 07/03/21 0910   ? ? Subjective I am going back to work half time   ? Currently in Pain? Yes   ? Pain Score 2    ? ?  ?  ? ?  ? ? ? ? ? ? ? ? ? ? ? ? ? ? ? ? ? ? ? ? [place] Adult PT Treatment/Exercise - 07/03/21 0001   ? ?  ? Lumbar Exercises: Aerobic   ? Elliptical 3/3 - warm up   ?  ? Lumbar Exercises: Standing  ? Other Standing Lumbar Exercises sumo squat - 30x   ?  ? Lumbar Exercises: Supine  ? Other Supine Lumbar Exercises lying on foam roller UE flexion, march, alt UE/LE - 20x each   ? Other Supine Lumbar Exercises ball overhead, thoracic and lumbar rotation, bent knee fall out - 20x each   ? ?  ?  ? ?  ? ? ? ? ? ? ? ? ? ? ? ? PT Short Term Goals - 05/06/21 0807   ? ?  ? PT SHORT TERM GOAL #1  ? Title ind with initial HEP   ? Status Achieved   ?  ? PT SHORT TERM GOAL #2  ? Title Pt will report at least 20% less pain   ? Baseline 30 % better   ? Status Achieved   ?  ? PT SHORT TERM GOAL #3  ? Title Pt will have hip flexion past 90 deg without pain to improve sitting posture   ? Baseline at 70 deg starts feeling increased pain   ? Status Partially Met   ? ?  ?  ? ?  ? ? ? ?  PT Long Term Goals - 06/12/21 1208   ? ?  ? PT LONG TERM GOAL #1  ? Title Pt will be ind with advanced HEP for return to work without pain   ? Baseline still learning, not yet to advanced exercises due to pain   ? Time 12   ? Period Weeks   ? Status On-going   ? Target Date 09/04/21   ?  ? PT LONG TERM GOAL #2  ? Title Pt will report at least 75% less pain   ? Baseline pain is better and tolerated pressure to incision sites now during STM   ? Time 12   ? Period Weeks   ? Status On-going   ? Target Date 09/04/21   ?  ? PT LONG TERM GOAL #3  ? Title Pt will demonstarte at least 120 deg hip flexion bil without increased pain   ? Baseline can get to past 95 with moderate pain   ? Time 12   ? Period Weeks   ? Status On-going   ? Target Date 09/04/21   ?  ? PT LONG TERM GOAL #4  ? Title Pt will be able to sit and stand for at least 4 hours to perform job related tasks without increased pain   ? Time 12   ? Period Weeks   ? Status On-going   ? Target Date 09/04/21   ?  ? PT LONG TERM GOAL #5  ? Title Pt will demonstrate squat and be able to lift at least 20 lb with good technique for return to  normal activities   ? Baseline not lifting weight due to pain   ? Time 12   ? Period Weeks   ? Status On-going   ? Target Date 09/04/21   ? ?  ?  ? ?  ? ? ? ? ? ? ? ? Plan - 07/03/21 0923   ? ? Clinical Impression Statement Pt was able to do a lot more challengeing core exercises.  Able to do moderately high level exercises on the foam roll.  He does still report feeling cramping in his lower abdomen and stretching in the perineal area on the Lt side.  Pt has not yet returned to work and will benefit from skilled PT to continue to work on strength as needed for return to maximum function.   ? PT Treatment/Interventions ADLs/Self Care Home Management;Biofeedback;Cryotherapy;Electrical Stimulation;Moist Heat;Therapeutic activities;Therapeutic exercise;Neuromuscular re-education;Patient/family education;Taping;Scar mobilization;Manual techniques;Passive range of motion;Dry needling   ? PT Next Visit Plan start with nustep or stepups; focus on core strength, DN adductors if needed   ? PT Home Exercise Plan Access Code: [email or web address]   ? Consulted and Agree with Plan of Care Patient   ? ?  ?  ? ?  ? ? ?Patient will benefit from skilled therapeutic intervention in order to improve the following deficits and impairments:  Decreased endurance, Difficulty walking, Increased muscle spasms, Pain, Impaired flexibility, Decreased strength ? ?Visit Diagnosis: ?Stiffness of left hip, not elsewhere classified ? ?Pain in left hip ? ?Muscle weakness (generalized) ? ? ? ? ?Problem List ?Patient Active Problem List  ? Diagnosis Date Noted  ? Infection due to Enterobacter cloacae 05/07/2011  ? 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 Subdural hematoma 05/06/2011  ? Expressive aphasia 05/06/2011  ? ? ?Jakki L Desenglau, PT ?07/03/2021, 5:22 PM ? ?[place] ?[facility] ?[address] ?[place], [place], [address] ?Phone: [phone]   Fax:    [phone] ? ?Name: Don Palmer ?MRN: [id number] ?Date of Birth:  04/19/1971 ? ? ? ?

## 2021-07-10 ENCOUNTER — Ambulatory Visit: Payer: BC Managed Care – PPO | Admitting: Physical Therapy

## 2021-07-10 ENCOUNTER — Other Ambulatory Visit: Payer: Self-pay

## 2021-07-10 ENCOUNTER — Encounter: Payer: Self-pay | Admitting: Physical Therapy

## 2021-07-10 DIAGNOSIS — M25652 Stiffness of left hip, not elsewhere classified: Secondary | ICD-10-CM | POA: Diagnosis not present

## 2021-07-10 DIAGNOSIS — M25552 Pain in left hip: Secondary | ICD-10-CM

## 2021-07-10 DIAGNOSIS — M6281 Muscle weakness (generalized): Secondary | ICD-10-CM

## 2021-07-10 NOTE — Therapy (Signed)
[place] ?[facility] ?[place], [place], [address] ?Phone: [phone]   Fax:  [phone] ? ?Physical Therapy Treatment ? ?Patient Details  ?Name: Don Palmer ?MRN: [id number] ?Date of Birth: 1971/02/02 ?Referring Provider (PT): Greer Pickerel, MD ? ? ?Encounter Date: 07/10/2021 ? ? PT End of Session - 07/10/21 1638   ? ? Visit Number 14   ? Date for PT Re-Evaluation 09/04/21   ? Authorization Type BCBS   ? PT Start Time [phone]   ? PT Stop Time [phone]   ? PT Time Calculation (min) 42 min   ? Activity Tolerance Patient tolerated treatment well   ? Behavior During Therapy [facility] for tasks assessed/performed   ? ?  ?  ? ?  ? ? ?Past Medical History:  ?Diagnosis Date  ? Brain abscess   ? Head injury, closed, with brief LOC ([place]) 2012  ? Headache(784.0)   ? has subdural hemotoma  ? No pertinent past medical history   ? Subdural hematoma   ? ? ?Past Surgical History:  ?Procedure Laterality Date  ? CRANIOTOMY  03/02/2011  ? Procedure: CRANIOTOMY HEMATOMA EVACUATION SUBDURAL;  Surgeon: Otilio Connors;  Location: [place] [facility];  Service: Neurosurgery;  Laterality: Left;  ? CRANIOTOMY  03/31/2011  ? Procedure: CRANIOTOMY HEMATOMA EVACUATION SUBDURAL;  Surgeon: Otilio Connors;  Location: [place] [facility];  Service: Neurosurgery;  Laterality: Left;  Left Craniectomy for Subdural Abcess  ? CRANIOTOMY  02/04/2012  ? Procedure: CRANIOTOMY BONE FLAP/PROSTHETIC PLATE;  Surgeon: Otilio Connors, MD;  Location: [place] [facility];  Service: Neurosurgery;  Laterality: Left;  Cranioplasty with Cranila mesh implant  ? SHOULDER OPEN ROTATOR CUFF REPAIR    ? 2010  left shoulder  ? ? ?There were no vitals filed for this visit. ? ? Subjective Assessment - 07/10/21 1007   ? ? Subjective Pt is going back to work next week.  States the abdomen not painful.  Feels pain underneath when sitting   ? Currently in Pain? Yes   no number mentioned  ? ?  ?  ? ?  ? ? ? ? ? ? ? ? ? ? ? ? ? ? ? ? ? ? ? ? [place]  Adult PT Treatment/Exercise - 07/10/21 0001   ? ?  ? Lumbar Exercises: Stretches  ? Active Hamstring Stretch Left;Right;30 seconds;2 reps   ? Hip Flexor Stretch Right;Left;2 reps;30 seconds   ? Other Lumbar Stretch Exercise butterfly stretch and hip rotation - 30sec   ?  ? Lumbar Exercises: Aerobic  ? Nustep L5 on blue - 8 min PT present for update   ?  ? Lumbar Exercises: Standing  ? Lifting Limitations dead lift no weight - cues to keep knees bent andback flat   ? Forward Lunge 20 reps   ? Forward Lunge Limitations BOSU   ? Side Lunge 20 reps   ? Side Lunge Limitations BOSU   ? Other Standing Lumbar Exercises sumo squat - 30x   15lb  ? Other Standing Lumbar Exercises hamstring curl on vibration plate 2x10; squat on vibration plate 2x10   ?  ? Lumbar Exercises: Supine  ? Bent Knee Raise 20 reps   ? Bent Knee Raise Limitations ball presses on LE   ? Bridge 10 reps   ? Bridge with [employer] 10 reps   ? ?  ?  ? ?  ? ? ? ? ? ? ? ? ? ? ? ?  PT Short Term Goals - 05/06/21 0807   ? ?  ? PT SHORT TERM GOAL #1  ? Title ind with initial HEP   ? Status Achieved   ?  ? PT SHORT TERM GOAL #2  ? Title Pt will report at least 20% less pain   ? Baseline 30 % better   ? Status Achieved   ?  ? PT SHORT TERM GOAL #3  ? Title Pt will have hip flexion past 90 deg without pain to improve sitting posture   ? Baseline at 70 deg starts feeling increased pain   ? Status Partially Met   ? ?  ?  ? ?  ? ? ? ? PT Long Term Goals - 07/10/21 1004   ? ?  ? PT LONG TERM GOAL #1  ? Title Pt will be ind with advanced HEP for return to work without pain   ? Status On-going   ?  ? PT LONG TERM GOAL #2  ? Title Pt will report at least 75% less pain   ? Baseline much better and tolerated functional squat and dead lift without deviations   ? Status Partially Met   ?  ? PT LONG TERM GOAL #3  ? Title Pt will demonstarte at least 120 deg hip flexion bil without increased pain   ? Baseline at least 100 deg   ? Status Partially Met   ?  ? PT LONG TERM GOAL  #4  ? Title Pt will be able to sit and stand for at least 4 hours to perform job related tasks without increased pain   ? Baseline resume work next week   ? Status On-going   ?  ? PT LONG TERM GOAL #5  ? Title Pt will demonstrate squat and be able to lift at least 20 lb with good technique for return to normal activities   ? Baseline 15 lb needs cues   ? Status Partially Met   ? ?  ?  ? ?  ? ? ? ? ? ? ? ? Plan - 07/10/21 0956   ? ? Clinical Impression Statement Pt continues to demonstrate progress.  He was able to do more exercises throughout the session today. He is needing cues for functional movement to evenly distribute weight bil and to keep spine in neutral position and not rounding  Pt will benefit from skilled PT to continue to progress challenge and functional exercises  for complete return to work.   ? PT Treatment/Interventions ADLs/Self Care Home Management;Biofeedback;Cryotherapy;Electrical Stimulation;Moist Heat;Therapeutic activities;Therapeutic exercise;Neuromuscular re-education;Patient/family education;Taping;Scar mobilization;Manual techniques;Passive range of motion;Dry needling   ? PT Next Visit Plan f/u on start to work; start with nustep or step ups; focus on functional lifting adding weights side lunge, dead lift, DN adductors if needed   ? PT Home Exercise Plan Access Code: [email or web address]   ? Consulted and Agree with Plan of Care Patient   ? ?  ?  ? ?  ? ? ?Patient will benefit from skilled therapeutic intervention in order to improve the following deficits and impairments:  Decreased endurance, Difficulty walking, Increased muscle spasms, Pain, Impaired flexibility, Decreased strength ? ?Visit Diagnosis: ?Stiffness of left hip, not elsewhere classified ? ?Pain in left hip ? ?Muscle weakness (generalized) ? ? ? ? ?Problem List ?Patient Active Problem List  ? Diagnosis Date Noted  ? Infection due to Enterobacter cloacae 05/07/2011  ? 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 Subdural hematoma 05/06/2011  ?  Expressive aphasia 05/06/2011  ? ? ?  Jule Ser, PT ?07/10/2021, 10:08 AM ? ?[facility] ?[facility] ?[place], [place], [address] ?Phone: [phone]   Fax:  [phone] ? ?Name: Don Palmer ?MRN: [id number] ?Date of Birth: Nov 28, 1970 ? ? ? ?

## 2021-07-15 ENCOUNTER — Encounter: Payer: BC Managed Care – PPO | Admitting: Physical Therapy

## 2021-07-29 ENCOUNTER — Encounter: Payer: BC Managed Care – PPO | Admitting: Physical Therapy

## 2021-07-29 ENCOUNTER — Ambulatory Visit: Payer: BC Managed Care – PPO | Admitting: Physical Therapy

## 2021-08-01 ENCOUNTER — Ambulatory Visit: Payer: BC Managed Care – PPO | Admitting: Physical Therapy

## 2021-08-05 ENCOUNTER — Encounter: Payer: Self-pay | Admitting: Physical Therapy

## 2021-08-12 ENCOUNTER — Encounter: Payer: Self-pay | Admitting: Physical Therapy

## 2021-08-19 ENCOUNTER — Encounter: Payer: Self-pay | Admitting: Physical Therapy

## 2021-08-26 ENCOUNTER — Encounter: Payer: Self-pay | Admitting: Physical Therapy

## 2021-11-12 ENCOUNTER — Encounter (HOSPITAL_COMMUNITY): Payer: Self-pay | Admitting: Emergency Medicine

## 2021-11-12 ENCOUNTER — Ambulatory Visit (HOSPITAL_COMMUNITY)
Admission: EM | Admit: 2021-11-12 | Discharge: 2021-11-12 | Disposition: A | Discharge status: Home / Self Care | Payer: BC Managed Care – PPO | Attending: Physician Assistant | Admitting: Physician Assistant

## 2021-11-12 DIAGNOSIS — R1032 Left lower quadrant pain: Secondary | ICD-10-CM | POA: Diagnosis not present

## 2021-11-12 DIAGNOSIS — M79605 Pain in left leg: Secondary | ICD-10-CM | POA: Diagnosis not present

## 2021-11-12 MED ORDER — IBUPROFEN 800 MG PO TABS: 800.0000 mg | ORAL_TABLET | Freq: Three times a day (TID) | ORAL | 0 refills | Status: DC

## 2021-11-12 MED ORDER — PREDNISONE 10 MG PO TABS: 10.0000 mg | ORAL_TABLET | Freq: Three times a day (TID) | ORAL | 0 refills | Status: DC

## 2021-11-12 NOTE — ED Provider Notes (Signed)
[facility]    CSN: [id number] Arrival date & time: 11/12/21  1445      History   Chief Complaint Chief Complaint  Patient presents with   Groin Pain    HPI Don Palmer is a 51 y.o. male.   51 year old male presents with left groin pain.  Patient relates he has had 2 left groin surgeries over the past year to repair hernia.  Patient indicates the first 1 was in early 2022 to where he had a direct hernia repaired.  Patient indicates he then had a recurrence of a hernia and had a second hernia repair which was then late 2022.  Patient indicates that he went back to work in May and has been having intermittent left groin pain since.  Patient indicates that the pain is moderate intensity and that it is worse when he stands for prolonged period of time or lies down on his back, pain also increases when he opens his left leg up and externally rotates.  Patient indicates he has taken ibuprofen over-the-counter with minimal relief, patient also has been treated by therapist having dry needling of the area and also vibratory therapy of the left leg to try to break up the scar tissue from the surgery.  Patient indicates when he had these therapies performed they did offer improvement but since he had not had those types of therapies performed for a while he continues to have pain and discomfort.  Patient relates he is not having any fever or chills, no urinary symptoms, no unusual bowel changes.  Patient relates that he is eating and drinking fluids well.  Patient is concerned about a possible hernia recurrence.   Groin Pain    Past Medical History:  Diagnosis Date   Brain abscess    Head injury, closed, with brief LOC ([place]) 2012   Headache(784.0)    has subdural hemotoma   No pertinent past medical history    Subdural hematoma ([place])     Patient Active Problem List   Diagnosis Date Noted   Infection due to Enterobacter cloacae 05/07/2011   Brain abscess 05/06/2011    Subdural hematoma ([place]) 05/06/2011   Expressive aphasia 05/06/2011    Past Surgical History:  Procedure Laterality Date   CRANIOTOMY  03/02/2011   Procedure: CRANIOTOMY HEMATOMA EVACUATION SUBDURAL;  Surgeon: Otilio Connors;  Location: [place] [facility];  Service: Neurosurgery;  Laterality: Left;   CRANIOTOMY  03/31/2011   Procedure: CRANIOTOMY HEMATOMA EVACUATION SUBDURAL;  Surgeon: Otilio Connors;  Location: [place] [facility];  Service: Neurosurgery;  Laterality: Left;  Left Craniectomy for Subdural Abcess   CRANIOTOMY  02/04/2012   Procedure: CRANIOTOMY BONE FLAP/PROSTHETIC PLATE;  Surgeon: Otilio Connors, MD;  Location: [facility] [facility];  Service: Neurosurgery;  Laterality: Left;  Cranioplasty with Cranila mesh implant   SHOULDER OPEN ROTATOR CUFF REPAIR     2010  left shoulder       Home Medications    Prior to Admission medications   Medication Sig Start Date End Date Taking? Authorizing Provider  ibuprofen (ADVIL) 800 MG tablet Take 1 tablet (800 mg total) by mouth 3 (three) times daily. 11/12/21  Yes Nyoka Lint, PA-C  predniSONE (DELTASONE) 10 MG tablet Take 1 tablet (10 mg total) by mouth 3 (three) times daily. 11/12/21  Yes Nyoka Lint, PA-C  erythromycin ophthalmic ointment Place a 1/2 inch ribbon of ointment into the lower eyelid. 10/13/19   Recardo Evangelist, PA-C  methocarbamol (ROBAXIN) 500 MG  tablet Take 1 tablet (500 mg total) by mouth at bedtime and may repeat dose one time if needed. 12/05/18   Bethel Born, PA-C    Family History Family History  Family history unknown: Yes    Social History Social History   Tobacco Use   Smoking status: Never   Smokeless tobacco: Never   Tobacco comments:    Quit in 1998  Substance Use Topics   Alcohol use: Yes    Alcohol/week: 2.0 standard drinks of alcohol    Types: 2 Cans of beer per week   Drug use: No     Allergies   Patient has no known allergies.   Review of Systems Review of Systems   Physical Exam Triage  Vital Signs ED Triage Vitals  Enc Vitals Group     BP 11/12/21 1515 122/88     Pulse Rate 11/12/21 1515 64     Resp 11/12/21 1515 15     Temp 11/12/21 1515 98.7 F (37.1 C)     Temp Source 11/12/21 1515 Oral     SpO2 11/12/21 1515 100 %     Weight --      Height --      Head Circumference --      Peak Flow --      Pain Score 11/12/21 1513 9     Pain Loc --      Pain Edu? --      Excl. in GC? --    No data found.  Updated Vital Signs BP 122/88 (BP Location: Left Arm)   Pulse 64   Temp 98.7 F (37.1 C) (Oral)   Resp 15   SpO2 100%   Visual Acuity Right Eye Distance:   Left Eye Distance:   Bilateral Distance:    Right Eye Near:   Left Eye Near:    Bilateral Near:     Physical Exam Constitutional:      Appearance: Normal appearance.  Genitourinary:      Comments: Left groin: Pain is palpated along the middle portion of the groin at the femoral artery area.  Pain is palpated on top of the femoral artery.  There is no swelling or redness at the site.  There is no left testicular pain or evidence of direct or indirect hernia.  When the leg is externally rotated and the frog position it increases the pain at the site.  When the legs are brought up to the fetal position there is no pain.  Femoral pulses normal and strong. Neurological:     Mental Status: He is alert.      UC Treatments / Results  Labs (all labs ordered are listed, but only abnormal results are displayed) Labs Reviewed - No data to display  EKG   Radiology No results found.  Procedures Procedures (including critical care time)  Medications Ordered in UC Medications - No data to display  Initial Impression / Assessment and Plan / UC Course  I have reviewed the triage vital signs and the nursing notes.  Pertinent labs & imaging results that were available during my care of the patient were reviewed by me and considered in my medical decision making (see chart for details).    Plan: 1.   Advised take ibuprofen 800 mg 1 every 8 hours with food to help decrease the pain 2.  Advised take prednisone 10 mg 1 3 times a day until completed to help reduce the groin pain. 3.  Advised for patient  to call his surgeon for an appointment to have the area evaluated. 5.  Advised to follow-up PCP or return to urgent care if symptoms fail to improve. Final Clinical Impressions(s) / UC Diagnoses   Final diagnoses:  Left groin pain  Left leg pain     Discharge Instructions      Advised take ibuprofen 800 mg 1 every 8 hours with food to help decrease the groin pain. Advised take prednisone 10 mg 1 tablet 3 times a day to decrease acute inflammation. Advised to follow-up with surgeon to evaluate the groin pain if the medicine fails to give improvement.    ED Prescriptions     Medication Sig Dispense Auth. Provider   predniSONE (DELTASONE) 10 MG tablet Take 1 tablet (10 mg total) by mouth 3 (three) times daily. 15 tablet Ellsworth Lennox, PA-C   ibuprofen (ADVIL) 800 MG tablet Take 1 tablet (800 mg total) by mouth 3 (three) times daily. 30 tablet Ellsworth Lennox, PA-C      PDMP not reviewed this encounter.   Ellsworth Lennox, PA-C 11/12/21 1548

## 2021-11-12 NOTE — ED Triage Notes (Signed)
Pt reports had second hernia surgery last year. Went back to work in May. Reports now that moving around more having pains in left groin. Denies any swelling, problems with urination or bowels.

## 2021-11-12 NOTE — Discharge Instructions (Signed)
Advised take ibuprofen 800 mg 1 every 8 hours with food to help decrease the groin pain. Advised take prednisone 10 mg 1 tablet 3 times a day to decrease acute inflammation. Advised to follow-up with surgeon to evaluate the groin pain if the medicine fails to give improvement.

## 2022-09-30 ENCOUNTER — Encounter (HOSPITAL_COMMUNITY): Payer: Self-pay | Admitting: *Deleted

## 2022-09-30 ENCOUNTER — Ambulatory Visit (HOSPITAL_COMMUNITY)
Admission: EM | Admit: 2022-09-30 | Discharge: 2022-09-30 | Disposition: A | Discharge status: Home / Self Care | Payer: BC Managed Care – PPO | Attending: Emergency Medicine | Admitting: Emergency Medicine

## 2022-09-30 DIAGNOSIS — M659 Synovitis and tenosynovitis, unspecified: Secondary | ICD-10-CM

## 2022-09-30 MED ORDER — NAPROXEN 500 MG PO TABS: 500.0000 mg | ORAL_TABLET | Freq: Two times a day (BID) | ORAL | 0 refills | Status: DC

## 2022-09-30 MED ORDER — KETOROLAC TROMETHAMINE 30 MG/ML IJ SOLN: 30.0000 mg | Freq: Once | INTRAMUSCULAR | Status: DC

## 2022-09-30 NOTE — ED Triage Notes (Signed)
Pt states he has a hx of trigger finger (ring finger) on his right hand. He has had injections in the past but it has been painful x 3 weeks again. He is taking no meds.

## 2022-09-30 NOTE — Discharge Instructions (Signed)
I believe that your trigger finger is returning.  Please take the anti-inflammatories twice daily while your pain persist.  Please try to rest your hand as well and avoid triggering your pain.  It is important that you follow-up with an orthopedic as they can potentially do local steroid injections, and surgery if needed.  I have attached information for [facility], since you have been there prior.   Please return to the urgent care for any new or urgent symptoms.

## 2022-09-30 NOTE — ED Provider Notes (Signed)
[facility]    CSN: [id number] Arrival date & time: 09/30/22  1619      History   Chief Complaint Chief Complaint  Patient presents with   Hand Pain    HPI Don MEECH is a 52 y.o. male.   Patient presents to clinic for complaints of a right handed ring finger pain and swelling.  Reports his finger occasionally gets stuck with movement.  He does have a history of trigger finger in 2012, in the same finger.  He had a procedure to repair his shoulder in 2012 and also had something done to his finger, unsure what procedure was done.  Reports what ever procedure he had helped him until about a month ago, when his symptoms resumed.  He denies any falls or trauma.  He has not tried anything for the pain.  He works with his hands.     The history is provided by the patient and medical records.  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/06/2011   Expressive aphasia 05/06/2011    Past Surgical History:  Procedure Laterality Date   CRANIOTOMY  03/02/2011   Procedure: CRANIOTOMY HEMATOMA EVACUATION SUBDURAL;  Surgeon: Clydene Fake;  Location: [facility];  Service: Neurosurgery;  Laterality: Left;   CRANIOTOMY  03/31/2011   Procedure: CRANIOTOMY HEMATOMA EVACUATION SUBDURAL;  Surgeon: Clydene Fake;  Location: [facility];  Service: Neurosurgery;  Laterality: Left;  Left Craniectomy for Subdural Abcess   CRANIOTOMY  02/04/2012   Procedure: CRANIOTOMY BONE FLAP/PROSTHETIC PLATE;  Surgeon: Clydene Fake, MD;  Location: [facility];  Service: Neurosurgery;  Laterality: Left;  Cranioplasty with Cranila mesh implant    SHOULDER OPEN ROTATOR CUFF REPAIR     2010  left shoulder       Home Medications    Prior to Admission medications   Medication Sig Start Date End Date Taking? Authorizing Provider  naproxen (NAPROSYN) 500 MG tablet Take 1 tablet (500 mg total) by mouth 2 (two) times daily. 09/30/22  Yes Rinaldo Ratel, [place] N, FNP  erythromycin ophthalmic ointment Place a 1/2 inch ribbon of ointment into the lower eyelid. 10/13/19   Bethel Born, PA-C  ibuprofen (ADVIL) 800 MG tablet Take 1 tablet (800 mg total) by mouth 3 (three) times daily. 11/12/21   Ellsworth Lennox, PA-C  methocarbamol (ROBAXIN) 500 MG tablet Take 1 tablet (500 mg total) by mouth at bedtime and may repeat dose one time if needed. 12/05/18   Bethel Born, PA-C  predniSONE (DELTASONE) 10 MG tablet Take 1 tablet (10 mg total) by mouth 3 (three) times daily. 11/12/21   Ellsworth Lennox, PA-C    Family History Family History  Family history unknown: Yes    Social History Social History   Tobacco Use   Smoking status: Never   Smokeless tobacco: Never   Tobacco comments:    Quit in 1998  Substance Use Topics   Alcohol use: Yes    Alcohol/week: 2.0 standard drinks of alcohol    Types: 2 Cans of beer per week  Drug use: No     Allergies   Patient has no known allergies.   Review of Systems Review of Systems  Musculoskeletal:  Positive for joint swelling.     Physical Exam Triage Vital Signs ED Triage Vitals  Enc Vitals Group     BP 09/30/22 1722 123/75     Pulse Rate 09/30/22 1722 (!) 57     Resp 09/30/22 1722 18     Temp 09/30/22 1722 (!) 97.5 F (36.4 C)     Temp Source 09/30/22 1722 Oral     SpO2 09/30/22 1722 97 %     Weight --      Height --      Head Circumference --      Peak Flow --      Pain Score 09/30/22 1721 8     Pain Loc --      Pain Edu? --      Excl. in GC? --    No data found.  Updated Vital Signs BP 123/75 (BP Location: Left Arm)   Pulse (!) 57   Temp (!) 97.5 F (36.4 C) (Oral)    Resp 18   SpO2 97%   Visual Acuity Right Eye Distance:   Left Eye Distance:   Bilateral Distance:    Right Eye Near:   Left Eye Near:    Bilateral Near:     Physical Exam Vitals and nursing note reviewed.  Constitutional:      Appearance: Normal appearance.  HENT:     Head: Normocephalic and atraumatic.     Right Ear: External ear normal.     Left Ear: External ear normal.     Nose: Nose normal.     Mouth/Throat:     Mouth: Mucous membranes are moist.  Eyes:     Conjunctiva/sclera: Conjunctivae normal.  Cardiovascular:     Rate and Rhythm: Normal rate.  Pulmonary:     Effort: Pulmonary effort is normal. No respiratory distress.  Musculoskeletal:        General: Swelling and tenderness present. No signs of injury.     Right hand: Swelling and tenderness present. No deformity or lacerations. Decreased range of motion. There is no disruption of two-point discrimination. Normal capillary refill. Normal pulse.     Comments: Right hand ring finger with swelling at the base, decreased range of motion and tenderness over the metacarpal.  Brisk capillary refill, sensation intact.  Skin:    General: Skin is warm and dry.     Findings: Erythema present.  Neurological:     Mental Status: He is alert.  Psychiatric:        Behavior: Behavior is cooperative.      UC Treatments / Results  Labs (all labs ordered are listed, but only abnormal results are displayed) Labs Reviewed - No data to display  EKG   Radiology No results found.  Procedures Procedures (including critical care time)  Medications Ordered in UC Medications - No data to display  Initial Impression / Assessment and Plan / UC Course  I have reviewed the triage vital signs and the nursing notes.  Pertinent labs & imaging results that were available during my care of the patient were reviewed by me and considered in my medical decision making (see chart for details).  Vitals and triage reviewed, patient  is hemodynamically stable.  History of trigger finger in his right ring finger, suspect this is returning.  Neurovascularly intact.  Limitation to range of motion with  finger extension occasionally.  Tenderness to metatarsal when palpated.  Offered IM Toradol injection for pain control, patient declined.  Advise NSAIDs and rest, work note provided.  Follow-up with [facility], who did his initial surgery to discuss other interventions.  Plan of care, follow-up care and return precautions given, no questions at this time.    Final Clinical Impressions(s) / UC Diagnoses   Final diagnoses:  Flexor tenosynovitis of finger     Discharge Instructions      I believe that your trigger finger is returning.  Please take the anti-inflammatories twice daily while your pain persist.  Please try to rest your hand as well and avoid triggering your pain.  It is important that you follow-up with an orthopedic as they can potentially do local steroid injections, and surgery if needed.  I have attached information for [facility], since you have been there prior.   Please return to the urgent care for any new or urgent symptoms.      ED Prescriptions     Medication Sig Dispense Auth. Provider   naproxen (NAPROSYN) 500 MG tablet Take 1 tablet (500 mg total) by mouth 2 (two) times daily. 30 tablet Raed Schalk, [place] N, [place]      PDMP not reviewed this encounter.   Sharilyn Geisinger, [place] N, [place] 09/30/22 [phone]

## 2022-10-08 ENCOUNTER — Telehealth (HOSPITAL_COMMUNITY): Payer: Self-pay

## 2022-10-08 NOTE — Telephone Encounter (Signed)
Patient came in and informed the front desk staff that he had lost his AVS from his 09/30/22 visit and would like a copy.   Paperwork printed and given to the front desk for the Patient.

## 2022-10-20 ENCOUNTER — Other Ambulatory Visit: Payer: Self-pay | Admitting: Orthopedic Surgery

## 2022-11-19 ENCOUNTER — Ambulatory Visit: Payer: BC Managed Care – PPO | Admitting: Orthopedic Surgery

## 2022-11-24 ENCOUNTER — Encounter (HOSPITAL_BASED_OUTPATIENT_CLINIC_OR_DEPARTMENT_OTHER): Payer: Self-pay

## 2022-11-24 ENCOUNTER — Ambulatory Visit (HOSPITAL_BASED_OUTPATIENT_CLINIC_OR_DEPARTMENT_OTHER): Admit: 2022-11-24 | Payer: BC Managed Care – PPO | Admitting: Orthopedic Surgery

## 2022-11-24 SURGERY — RELEASE, A1 PULLEY, FOR TRIGGER FINGER
Anesthesia: Choice | Laterality: Right

## 2022-12-07 ENCOUNTER — Ambulatory Visit (HOSPITAL_COMMUNITY)
Admission: EM | Admit: 2022-12-07 | Discharge: 2022-12-07 | Disposition: A | Discharge status: Home / Self Care | Payer: BC Managed Care – PPO | Attending: Physician Assistant | Admitting: Physician Assistant

## 2022-12-07 ENCOUNTER — Other Ambulatory Visit: Payer: Self-pay

## 2022-12-07 ENCOUNTER — Encounter (HOSPITAL_COMMUNITY): Payer: Self-pay | Admitting: Emergency Medicine

## 2022-12-07 DIAGNOSIS — M545 Low back pain, unspecified: Secondary | ICD-10-CM

## 2022-12-07 MED ORDER — LIDOCAINE 5 % EX PTCH: 1.0000 | MEDICATED_PATCH | CUTANEOUS | 0 refills | Status: AC

## 2022-12-07 MED ORDER — METHOCARBAMOL 500 MG PO TABS: 500.0000 mg | ORAL_TABLET | Freq: Two times a day (BID) | ORAL | 0 refills | Status: AC

## 2022-12-07 MED ORDER — DICLOFENAC SODIUM 75 MG PO TBEC: 75.0000 mg | DELAYED_RELEASE_TABLET | Freq: Two times a day (BID) | ORAL | 0 refills | Status: AC | PRN

## 2022-12-07 NOTE — ED Provider Notes (Signed)
[facility]    CSN: [id number] Arrival date & time: 12/07/22  0944      History   Chief Complaint Chief Complaint  Patient presents with   Back Pain    HPI Don Palmer is a 52 y.o. male.   Patient presents today with a several week history of lower back pain.  He reports that several months ago he had a similar episode but this resolved without intervention and then recurred a few weeks ago.  He denies any specific injury or change in activity but does report that he has been working out and wonders if he could have injured himself there.  Denies any bowel/bladder incontinence, lower extremity weakness, saddle anesthesia, fever, nausea, vomiting.  Denies previous surgery involving his spine.  He has taken Tylenol ibuprofen without improvement of symptoms.  He denies history of malignancy, diabetes, cardiovascular disease.  He is having difficulty with daily duties as a result of symptoms.    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   Subdural hematoma (HCC) 05/06/2011   Expressive aphasia 05/06/2011    Past Surgical History:  Procedure Laterality Date   CRANIOTOMY  03/02/2011   Procedure: CRANIOTOMY HEMATOMA EVACUATION SUBDURAL;  Surgeon: Clydene Fake;  Location: [facility];  Service: Neurosurgery;  Laterality: Left;   CRANIOTOMY  03/31/2011   Procedure: CRANIOTOMY HEMATOMA EVACUATION SUBDURAL;  Surgeon: Clydene Fake;  Location: [facility];  Service: Neurosurgery;  Laterality: Left;  Left Craniectomy for Subdural Abcess   CRANIOTOMY  02/04/2012   Procedure: CRANIOTOMY BONE FLAP/PROSTHETIC PLATE;  Surgeon: Clydene Fake, MD;  Location: [facility];   Service: Neurosurgery;  Laterality: Left;  Cranioplasty with Cranila mesh implant   SHOULDER OPEN ROTATOR CUFF REPAIR     2010  left shoulder       Home Medications    Prior to Admission medications   Medication Sig Start Date End Date Taking? Authorizing Provider  diclofenac (VOLTAREN) 75 MG EC tablet Take 1 tablet (75 mg total) by mouth 2 (two) times daily as needed. 12/07/22  Yes Breklyn Fabrizio K, PA-C  lidocaine (LIDODERM) 5 % Place 1 patch onto the skin daily. Remove & Discard patch within 12 hours or as directed by MD 12/07/22  Yes Escher Harr, Noberto Retort, PA-C  methocarbamol (ROBAXIN) 500 MG tablet Take 1 tablet (500 mg total) by mouth 2 (two) times daily. 12/07/22  Yes Oviya Ammar, Noberto Retort, PA-C    Family History Family History  Family history unknown: Yes    Social History Social History   Tobacco Use   Smoking status: Never   Smokeless tobacco: Never   Tobacco comments:    Quit in 1998  Vaping Use   Vaping status: Never Used  Substance Use Topics   Alcohol use: Yes    Alcohol/week: 2.0 standard drinks of alcohol    Types: 2 Cans of beer per week   Drug use: No     Allergies   Patient has no known allergies.   Review of Systems Review of Systems  Constitutional:  Positive for activity change. Negative for appetite change, fatigue and fever.  Gastrointestinal:  Negative for abdominal pain, diarrhea, nausea and vomiting.  Genitourinary:  Negative for difficulty urinating.  Musculoskeletal:  Positive for back pain. Negative for arthralgias and myalgias.  Neurological:  Negative for weakness and numbness.     Physical Exam Triage Vital Signs ED Triage Vitals  Encounter Vitals Group     BP 12/07/22 1013 119/65     Systolic BP Percentile --      Diastolic BP Percentile --      Pulse Rate 12/07/22 1013 75     Resp 12/07/22 1013 18     Temp 12/07/22 1013 98.3 F (36.8 C)     Temp Source 12/07/22 1013 Oral     SpO2 12/07/22 1013 98 %     Weight --      Height --       Head Circumference --      Peak Flow --      Pain Score 12/07/22 1011 5     Pain Loc --      Pain Education --      Exclude from Growth Chart --    No data found.  Updated Vital Signs BP 119/65 (BP Location: Right Arm) Comment (BP Location): large cuff  Pulse 75   Temp 98.3 F (36.8 C) (Oral)   Resp 18   SpO2 98%   Visual Acuity Right Eye Distance:   Left Eye Distance:   Bilateral Distance:    Right Eye Near:   Left Eye Near:    Bilateral Near:     Physical Exam Vitals reviewed.  Constitutional:      General: He is awake.     Appearance: Normal appearance. He is well-developed. He is not ill-appearing.     Comments: Very pleasant male appear stated age in no acute distress sitting comfortably in exam room  HENT:     Head: Normocephalic and atraumatic.  Cardiovascular:     Rate and Rhythm: Normal rate and regular rhythm.     Heart sounds: Normal heart sounds, S1 normal and S2 normal. No murmur heard. Pulmonary:     Effort: Pulmonary effort is normal.     Breath sounds: Normal breath sounds. No stridor. No wheezing, rhonchi or rales.     Comments: Clear to auscultation bilaterally Musculoskeletal:     Cervical back: No tenderness or bony tenderness.     Thoracic back: No tenderness or bony tenderness.     Lumbar back: Tenderness present. No spasms or bony tenderness. Positive right straight leg raise test and positive left straight leg raise test.     Comments: Back: No pain percussion of vertebrae.  No deformity or step-off noted.  Tenderness palpation of bilateral lumbar paraspinal muscles without spasm.  Positive straight leg raise and Faber bilaterally.  Neurological:     Mental Status: He is alert.  Psychiatric:        Behavior: Behavior is cooperative.      UC Treatments / Results  Labs (all labs ordered are listed, but only abnormal results are displayed) Labs Reviewed - No data to display  EKG   Radiology No results found.  Procedures Procedures  (including critical care time)  Medications Ordered in UC Medications - No data to display  Initial Impression / Assessment and Plan / UC Course  I have reviewed the triage vital signs and the nursing notes.  Pertinent labs & imaging results that were available during my care of the patient were reviewed by me and considered in my  medical decision making (see chart for details).     Patient is well-appearing, afebrile, nontoxic, nontachycardic.  Patient denies any alarm symptoms that warrant emergent evaluation or imaging.  Plain films were deferred as he denies any recent trauma and has no focal bony tenderness.  Suspect muscular etiology.  He was started on Diclofenac twice daily with instruction to take additional NSAIDs with this medication due to risk of GI bleeding.  Can use acetaminophen/Tylenol for breakthrough pain.  Was prescribed Robaxin up to 2 times a day.  Discussed this can be sedating and he is not to drive or drink alcohol while taking it.  He was also given lidocaine patches for symptom relief and we discussed that these should be placed for 12 hours during the day and then remove for 12 hours at night; use only 1 patch per 24 hours.  Recommended conservative treatment measures including heat, rest, stretch.  He is to follow-up with sports medicine if his symptoms or not improving quickly and was given contact information for local provider with instruction to call to schedule an appointment.  Discussed that if he has any worsening symptoms including severe pain, bowel/bladder incontinence, lower extremity weakness, saddle anesthesia he needs to be seen immediately.  Strict return precautions given.  Work excuse note provided.   Final Clinical Impressions(s) / UC Diagnoses   Final diagnoses:  Acute bilateral low back pain without sciatica     Discharge Instructions      I believe that you have injured the muscles in your back.  Please start diclofenac twice a day.  Do not  take other NSAIDs with this medication including aspirin, ibuprofen/Advil, naproxen/Aleve.  You can use acetaminophen/Tylenol for breakthrough pain.  Take Robaxin up to 2 times a day.  This will make you sleepy so do not drive or drink alcohol taking it.  Apply lidocaine patch for 12 hours during the day; remove this for 12 hours at night.  Use only 1 patch per 24 hours.  I recommend you follow-up with sports medicine; call to schedule an appointment.  If anything worsens and you have severe pain, going to the bathroom on yourself without noticing it, numbness or tingling in your legs, weakness in your legs you need to be seen immediately.      ED Prescriptions     Medication Sig Dispense Auth. Provider   diclofenac (VOLTAREN) 75 MG EC tablet Take 1 tablet (75 mg total) by mouth 2 (two) times daily as needed. 20 tablet Gaylynn Seiple K, PA-C   methocarbamol (ROBAXIN) 500 MG tablet Take 1 tablet (500 mg total) by mouth 2 (two) times daily. 20 tablet Maryanne Huneycutt K, PA-C   lidocaine (LIDODERM) 5 % Place 1 patch onto the skin daily. Remove & Discard patch within 12 hours or as directed by MD 30 patch Orvile Corona K, PA-C      PDMP not reviewed this encounter.   Jeani Hawking, PA-C 12/07/22 1037

## 2022-12-07 NOTE — ED Triage Notes (Signed)
Mid to lower back pain for 2 weeks.  Patient has had this pain in the past.  Patient works out in [employer] and thinks he may have pushed himself to hard with this pain.  Denies any other symptoms.     Has had tylenol and ibuprofen and has taken them today

## 2022-12-07 NOTE — Discharge Instructions (Signed)
I believe that you have injured the muscles in your back.  Please start diclofenac twice a day.  Do not take other NSAIDs with this medication including aspirin, ibuprofen/Advil, naproxen/Aleve.  You can use acetaminophen/Tylenol for breakthrough pain.  Take Robaxin up to 2 times a day.  This will make you sleepy so do not drive or drink alcohol taking it.  Apply lidocaine patch for 12 hours during the day; remove this for 12 hours at night.  Use only 1 patch per 24 hours.  I recommend you follow-up with sports medicine; call to schedule an appointment.  If anything worsens and you have severe pain, going to the bathroom on yourself without noticing it, numbness or tingling in your legs, weakness in your legs you need to be seen immediately.

## 2023-07-14 ENCOUNTER — Other Ambulatory Visit: Payer: Self-pay

## 2023-07-14 ENCOUNTER — Encounter (HOSPITAL_COMMUNITY): Payer: Self-pay | Admitting: Emergency Medicine

## 2023-07-14 ENCOUNTER — Encounter (HOSPITAL_COMMUNITY): Payer: Self-pay

## 2023-07-14 ENCOUNTER — Ambulatory Visit (HOSPITAL_COMMUNITY): Admission: EM | Admit: 2023-07-14 | Discharge: 2023-07-14 | Disposition: A | Discharge status: Home / Self Care

## 2023-07-14 ENCOUNTER — Emergency Department (HOSPITAL_COMMUNITY)
Admission: EM | Admit: 2023-07-14 | Discharge: 2023-07-14 | Disposition: A | Discharge status: Home / Self Care | Attending: Emergency Medicine | Admitting: Emergency Medicine

## 2023-07-14 ENCOUNTER — Emergency Department (HOSPITAL_COMMUNITY)

## 2023-07-14 DIAGNOSIS — R519 Headache, unspecified: Secondary | ICD-10-CM | POA: Insufficient documentation

## 2023-07-14 LAB — BASIC METABOLIC PANEL
Anion gap: 8 (ref 5–15)
BUN: 8 mg/dL (ref 6–20)
CO2: 25 mmol/L (ref 22–32)
Calcium: 9.4 mg/dL (ref 8.9–10.3)
Chloride: 107 mmol/L (ref 98–111)
Creatinine, Ser: 1.42 mg/dL — ABNORMAL HIGH (ref 0.61–1.24)
GFR, Estimated: 59 mL/min — ABNORMAL LOW (ref >60)
Glucose, Bld: 83 mg/dL (ref 70–99)
Potassium: 3.8 mmol/L (ref 3.5–5.1)
Sodium: 140 mmol/L (ref 135–145)

## 2023-07-14 LAB — CBC WITH DIFFERENTIAL/PLATELET
Abs Immature Granulocytes: 0.01 10*3/uL (ref 0.00–0.07)
Basophils Absolute: 0 10*3/uL (ref 0.0–0.1)
Basophils Relative: 1 %
Eosinophils Absolute: 0 10*3/uL (ref 0.0–0.5)
Eosinophils Relative: 1 %
HCT: 47.7 % (ref 39.0–52.0)
Hemoglobin: 15.7 g/dL (ref 13.0–17.0)
Immature Granulocytes: 0 %
Lymphocytes Relative: 44 %
Lymphs Abs: 2.8 10*3/uL (ref 0.7–4.0)
MCH: 28 pg (ref 26.0–34.0)
MCHC: 32.9 g/dL (ref 30.0–36.0)
MCV: 85 fL (ref 80.0–100.0)
Monocytes Absolute: 0.6 10*3/uL (ref 0.1–1.0)
Monocytes Relative: 9 %
Neutro Abs: 2.8 10*3/uL (ref 1.7–7.7)
Neutrophils Relative %: 45 %
Platelets: 308 10*3/uL (ref 150–400)
RBC: 5.61 MIL/uL (ref 4.22–5.81)
RDW: 14.4 % (ref 11.5–15.5)
WBC: 6.2 10*3/uL (ref 4.0–10.5)
nRBC: 0 % (ref 0.0–0.2)

## 2023-07-14 MED ORDER — DIPHENHYDRAMINE HCL 50 MG/ML IJ SOLN
12.5000 mg | Freq: Once | INTRAMUSCULAR | Status: AC
  Administered 2023-07-14: 12.5 mg via INTRAVENOUS
  Filled 2023-07-14: qty 1

## 2023-07-14 MED ORDER — ACETAMINOPHEN 500 MG PO TABS
1000.0000 mg | ORAL_TABLET | Freq: Once | ORAL | Status: AC
  Administered 2023-07-14: 1000 mg via ORAL
  Filled 2023-07-14: qty 2

## 2023-07-14 MED ORDER — METOCLOPRAMIDE HCL 5 MG/ML IJ SOLN
10.0000 mg | Freq: Once | INTRAMUSCULAR | Status: AC
  Administered 2023-07-14: 10 mg via INTRAVENOUS
  Filled 2023-07-14: qty 2

## 2023-07-14 NOTE — Discharge Instructions (Signed)
 Go to the ER for further evaluation of our headache.

## 2023-07-14 NOTE — ED Provider Notes (Signed)
 Patient presents with persistent right sided headache x 2 days.  Patient also endorses intermittent bilateral blurred vision with his headache.  Denies nausea, vomiting, dizziness, weakness, numbness, slurred speech, and confusion.  Denies any recent falls or hitting his head.  Reports he has been taking ibuprofen with no relief.  Patient has history of subdural hematoma in 2012 where he had a craniotomy and then developed an abscess which required emergent craniotomy at that time.  Patient states that he never gets headaches and states that the pain is as severe as when the initial subdural hematoma had occurred.  Patient is anxious that this headache could be related.  Upon assessment no obvious neurodeficits noted at this time.  EOMI and PERRLA.  A&O x 3.  GCS 15.  Discussed with patient that it is difficult for me to rule out any underlying injury or cause for his headache due to lack of resources urgent care.  Due to complicated history recommended that patient be seen in the ER for further evaluation and imaging to ensure there is no underlying injury that is causing his headache.  Patient agreeable to plan at this time.  Patient is stable to arrived to the ER via POV.   Wynonia Lawman A, NP 07/14/23 1348

## 2023-07-14 NOTE — Discharge Instructions (Addendum)
 Please follow-up with primary care doctor.  If headache returns you can try Tylenol or ibuprofen.  In the event that you have headache that sudden onset severe or any new or worsening symptoms please return.  Your creatinine which is a reflection of kidney function is slightly elevated at 1.4 --when you follow-up with primary care, please have this rechecked.

## 2023-07-14 NOTE — ED Provider Notes (Signed)
 Don Palmer Provider Note   CSN: [id number] Arrival date & time: 07/14/23  1354     History  Chief Complaint  Patient presents with   Headache    Don Palmer is a 53 y.o. male patient with past medical history of traumatic subdural hematoma, history of brain abscess status post craniotomy, frequent headaches presenting to emergency room with complaint of headache.  Patient reports that he has had a headache for 2 days.  Patient reports that his headache is right sided and has radiated to the back of his head.  He reports feeling "foggy" vision, but no change in vision.  He has not had any new injury trauma or fall.  Headache started gradually yesterday and has progressively worsened.  He has not noticed any sensitivity to light or noise.  No nausea.  No numbness tingling or dizziness.  Was seen at urgent care prior to coming to emergency room and was sent here for further workup.   Headache      Home Medications Prior to Admission medications   Medication Sig Start Date End Date Taking? Authorizing Provider  diclofenac (VOLTAREN) 75 MG EC tablet Take 1 tablet (75 mg total) by mouth 2 (two) times daily as needed. 12/07/22   Raspet, Erin K, PA-C  lidocaine (LIDODERM) 5 % Place 1 patch onto the skin daily. Remove & Discard patch within 12 hours or as directed by MD 12/07/22   Raspet, Noberto Retort, PA-C  methocarbamol (ROBAXIN) 500 MG tablet Take 1 tablet (500 mg total) by mouth 2 (two) times daily. 12/07/22   Raspet, Noberto Retort, PA-C      Allergies    Patient has no known allergies.    Review of Systems   Review of Systems  Neurological:  Positive for headaches.    Physical Exam Updated Vital Signs BP 122/87 (BP Location: Right Arm)   Pulse (!) 58   Temp 98 F (36.7 C)   Resp 16   Ht 5\' 6"  (1.676 m)   Wt 83.9 kg   SpO2 100%   BMI 29.86 kg/m  Physical Exam Vitals and nursing note reviewed.  Constitutional:      General: He is not  in acute distress.    Appearance: He is not toxic-appearing.  HENT:     Head: Normocephalic and atraumatic.  Eyes:     General: No visual field deficit or scleral icterus.    Conjunctiva/sclera: Conjunctivae normal.  Cardiovascular:     Rate and Rhythm: Normal rate and regular rhythm.     Pulses: Normal pulses.     Heart sounds: Normal heart sounds.  Pulmonary:     Effort: Pulmonary effort is normal. No respiratory distress.     Breath sounds: Normal breath sounds.  Abdominal:     General: Abdomen is flat. Bowel sounds are normal.     Palpations: Abdomen is soft.     Tenderness: There is no abdominal tenderness.  Musculoskeletal:     Cervical back: No rigidity.  Skin:    General: Skin is warm and dry.     Findings: No lesion.  Neurological:     General: No focal deficit present.     Mental Status: He is alert and oriented to person, place, and time. Mental status is at baseline.     GCS: GCS eye subscore is 4. GCS verbal subscore is 5. GCS motor subscore is 6.     Cranial Nerves: No cranial nerve deficit  or facial asymmetry.     Sensory: No sensory deficit.     Motor: No weakness.     Coordination: Coordination normal.     ED Results / Procedures / Treatments   Labs (all labs ordered are listed, but only abnormal results are displayed) Labs Reviewed  BASIC METABOLIC PANEL - Abnormal; Notable for the following components:      Result Value   Creatinine, Ser 1.42 (*)    GFR, Estimated 59 (*)    All other components within normal limits  CBC WITH DIFFERENTIAL/PLATELET    EKG None  Radiology CT Head Wo Contrast Result Date: 07/14/2023 CLINICAL DATA:  Headache, increasing frequency or severity Pt states he have a bad headache that started two days ago which is out the ordinary. Pt had an accident years ago where he had bad headache EXAM: CT HEAD WITHOUT CONTRAST TECHNIQUE: Contiguous axial images were obtained from the base of the skull through the vertex without  intravenous contrast. RADIATION DOSE REDUCTION: This exam was performed according to the departmental dose-optimization program which includes automated exposure control, adjustment of the mA and/or kV according to patient size and/or use of iterative reconstruction technique. COMPARISON:  CT head 05/29/2011 FINDINGS: Brain: No evidence of large-territorial acute infarction. No parenchymal hemorrhage. No mass lesion. No extra-axial collection. No mass effect or midline shift. No hydrocephalus. Basilar cisterns are patent. Vascular: No hyperdense vessel. Skull: Prior left craniotomy.  No acute fracture or focal lesion. Sinuses/Orbits: Paranasal sinuses and mastoid air cells are clear. The orbits are unremarkable. Other: None. IMPRESSION: No acute intracranial abnormality. Electronically Signed   By: Don Palmer M.D.   On: 07/14/2023 18:22    Procedures Procedures    Medications Ordered in ED Medications - No data to display  ED Course/ Medical Decision Making/ A&P Clinical Course as of 07/14/23 2247  Wed Jul 14, 2023  1834 Reassessed.  He reports he is feeling much better. CT negative.  [JB]    Clinical Course User Index [JB] Ikaika Showers, Horald Chestnut, PA-C                                 Medical Decision Making Amount and/or Complexity of Data Reviewed Radiology: ordered.  Risk OTC drugs. Prescription drug management.   [name] 53 y.o. presented today for HA. Working Ddx: tension headache, migraine, intracranial mass, intracranial hemorrhage, intracranial infection including meningitis vs encephalitis, trigeminal neuralgia, AVM, sinusitis, cerebral aneurysm, muscular headache, cavernous sinus thrombosis, carotid artery dissection.  R/o DDx: intracranial mass, hemorrhage,or infection including meningitis vs encephalitis, trigeminal neuralgia, AVM, cerebral aneurysm, muscular headache, cavernous sinus thrombosis, carotid artery dissection are less likely due to history of present  illness, physical exam, labs/imaging findings  PMHX: traumatic subdural hematoma, history of brain abscess status post craniotomy, frequent headaches  Review of prior external notes: 07/14/23  Unique Tests and My Interpretation:  CBC: No leukocytosis, no anemia. BMP: Cr 1.42, unclear baseline. Normal electrolytes.   CT Head w/o Contrast: negative   Problem List / ED Course / Critical interventions / Medication management  Reporting to emergency room with headache.  This is day 2.  He has no injury trauma or fall.  He does not have any focal neurological deficits.  He is ambulatory.  He is alert oriented answering questions appropriately with no slurred speech.  His pupils are equal and reactive.  No nystagmus.  He is hemodynamically stable.  Will check  labs as well as CT scan to rule out any acute abnormality.  Will also give GI cocktail and reassess. Patient's labs are overall very reassuring.  I discussed that his creatinine is elevated but I do not have baseline to compare to.  Express he should follow-up with primary care for lab recheck.  His CT scan shows no acute intercranial abnormality.  Given reassuring workup and improvement in symptoms after GI cocktail feel this is likely a bad headache.  He will follow-up with primary care.  He is stable for discharge. I ordered medication including migraine cocktail.  Reevaluation of the patient after these medicines showed that the patient improved Patients vitals assessed. Upon arrival patient is hemodynamically stable.  I have reviewed the patients home medicines and have made adjustments as needed     Plan:   F/u w/ PCP in 2-3d to ensure resolution of sx.  Patient was given return precautions. Patient stable for discharge at this time.  Patient educated on sx/dx and verbalized understanding of plan. Return to ED if new or worsening sx.           Final Clinical Impression(s) / ED Diagnoses Final diagnoses:  Bad headache     Rx / DC Orders ED Discharge Orders     None         Reinaldo Raddle 07/14/23 2248    Jacalyn Lefevre, MD 07/15/23 [phone]

## 2023-07-14 NOTE — ED Triage Notes (Addendum)
 Patient has had a headache for 2 days.  Pain initially was right side of head and radiating around right side to forehead today.  No cough, cold, or runny nose  No recent head injury  Has taken ibuprofen.    Patient has a history of head injury.  Has not had head pain like this since then and is very anxious.  The last time he had pain like this 2 day episode , he was having issues from head injury and infection related to this event.

## 2023-07-14 NOTE — ED Triage Notes (Signed)
 Pt states he have a bad headache that started two days ago which is out the ordinary.   Pt had an accident years ago where he had bad headache and he found out a vessel had burst.  Pt states his spouse says he is acting the same when his vessel burst.   Pt says he has a little blurred vision and lower back pain.  Pt denies numbness, tingling, and weakness.

## 2024-01-25 ENCOUNTER — Encounter (HOSPITAL_BASED_OUTPATIENT_CLINIC_OR_DEPARTMENT_OTHER): Payer: Self-pay | Admitting: Orthopedic Surgery

## 2024-01-25 ENCOUNTER — Other Ambulatory Visit: Payer: Self-pay

## 2024-02-02 ENCOUNTER — Encounter (HOSPITAL_BASED_OUTPATIENT_CLINIC_OR_DEPARTMENT_OTHER): Payer: Self-pay | Admitting: Anesthesiology

## 2024-02-02 ENCOUNTER — Encounter (HOSPITAL_BASED_OUTPATIENT_CLINIC_OR_DEPARTMENT_OTHER): Payer: Self-pay | Admitting: Orthopedic Surgery

## 2024-02-02 ENCOUNTER — Encounter (HOSPITAL_BASED_OUTPATIENT_CLINIC_OR_DEPARTMENT_OTHER)
Admission: RE | Disposition: A | Discharge status: Home / Self Care | Payer: Self-pay | Source: Home / Self Care | Attending: Orthopedic Surgery

## 2024-02-02 ENCOUNTER — Ambulatory Visit (HOSPITAL_BASED_OUTPATIENT_CLINIC_OR_DEPARTMENT_OTHER)
Admission: RE | Admit: 2024-02-02 | Discharge: 2024-02-02 | Disposition: A | Discharge status: Home / Self Care | Attending: Orthopedic Surgery | Admitting: Orthopedic Surgery

## 2024-02-02 ENCOUNTER — Other Ambulatory Visit: Payer: Self-pay

## 2024-02-02 ENCOUNTER — Ambulatory Visit (HOSPITAL_BASED_OUTPATIENT_CLINIC_OR_DEPARTMENT_OTHER): Payer: Self-pay | Admitting: Anesthesiology

## 2024-02-02 DIAGNOSIS — M65331 Trigger finger, right middle finger: Secondary | ICD-10-CM | POA: Insufficient documentation

## 2024-02-02 DIAGNOSIS — G060 Intracranial abscess and granuloma: Secondary | ICD-10-CM

## 2024-02-02 DIAGNOSIS — A498 Other bacterial infections of unspecified site: Secondary | ICD-10-CM

## 2024-02-02 DIAGNOSIS — Z87820 Personal history of traumatic brain injury: Secondary | ICD-10-CM | POA: Insufficient documentation

## 2024-02-02 DIAGNOSIS — M65841 Other synovitis and tenosynovitis, right hand: Secondary | ICD-10-CM | POA: Diagnosis not present

## 2024-02-02 DIAGNOSIS — R4701 Aphasia: Secondary | ICD-10-CM

## 2024-02-02 DIAGNOSIS — S065XAA Traumatic subdural hemorrhage with loss of consciousness status unknown, initial encounter: Secondary | ICD-10-CM

## 2024-02-02 DIAGNOSIS — Z01818 Encounter for other preprocedural examination: Secondary | ICD-10-CM

## 2024-02-02 HISTORY — PX: TRIGGER FINGER RELEASE: SHX641

## 2024-02-02 SURGERY — RELEASE, A1 PULLEY, FOR TRIGGER FINGER
Anesthesia: Monitor Anesthesia Care | Site: Hand | Laterality: Right

## 2024-02-02 MED ORDER — FENTANYL CITRATE (PF) 100 MCG/2ML IJ SOLN: 25.0000 ug | INTRAMUSCULAR | Status: DC | PRN

## 2024-02-02 MED ORDER — BUPIVACAINE HCL (PF) 0.25 % IJ SOLN
INTRAMUSCULAR | Status: DC | PRN
  Administered 2024-02-02: 10 mL

## 2024-02-02 MED ORDER — FENTANYL CITRATE (PF) 100 MCG/2ML IJ SOLN
INTRAMUSCULAR | Status: DC | PRN
  Administered 2024-02-02 (×2): 50 ug via INTRAVENOUS

## 2024-02-02 MED ORDER — ACETAMINOPHEN 500 MG PO TABS
1000.0000 mg | ORAL_TABLET | Freq: Once | ORAL | Status: AC
  Administered 2024-02-02: 1000 mg via ORAL

## 2024-02-02 MED ORDER — ACETAMINOPHEN 500 MG PO TABS
ORAL_TABLET | ORAL | Status: AC
  Filled 2024-02-02: qty 2

## 2024-02-02 MED ORDER — PROPOFOL 500 MG/50ML IV EMUL
INTRAVENOUS | Status: DC | PRN
  Administered 2024-02-02: 75 ug/kg/min via INTRAVENOUS

## 2024-02-02 MED ORDER — CEFAZOLIN SODIUM-DEXTROSE 2-4 GM/100ML-% IV SOLN
2.0000 g | INTRAVENOUS | Status: AC
  Administered 2024-02-02: 2 g via INTRAVENOUS

## 2024-02-02 MED ORDER — ONDANSETRON HCL 4 MG/2ML IJ SOLN
INTRAMUSCULAR | Status: AC
  Filled 2024-02-02: qty 2

## 2024-02-02 MED ORDER — KETOROLAC TROMETHAMINE 30 MG/ML IJ SOLN
INTRAMUSCULAR | Status: AC
  Filled 2024-02-02: qty 1

## 2024-02-02 MED ORDER — LIDOCAINE 2% (20 MG/ML) 5 ML SYRINGE
INTRAMUSCULAR | Status: AC
  Filled 2024-02-02: qty 5

## 2024-02-02 MED ORDER — AMISULPRIDE (ANTIEMETIC) 5 MG/2ML IV SOLN: 10.0000 mg | Freq: Once | INTRAVENOUS | Status: DC | PRN

## 2024-02-02 MED ORDER — CEFAZOLIN SODIUM-DEXTROSE 2-4 GM/100ML-% IV SOLN
INTRAVENOUS | Status: AC
Start: 2024-02-02 — End: 2024-02-02
  Filled 2024-02-02: qty 100

## 2024-02-02 MED ORDER — OXYCODONE HCL 5 MG/5ML PO SOLN: 5.0000 mg | Freq: Once | ORAL | Status: DC | PRN

## 2024-02-02 MED ORDER — PROPOFOL 10 MG/ML IV BOLUS
INTRAVENOUS | Status: DC | PRN
Start: 2024-02-02 — End: 2024-02-02
  Administered 2024-02-02: 40 mg via INTRAVENOUS

## 2024-02-02 MED ORDER — DEXMEDETOMIDINE HCL IN NACL 80 MCG/20ML IV SOLN
INTRAVENOUS | Status: DC | PRN
  Administered 2024-02-02: 6 ug via INTRAVENOUS

## 2024-02-02 MED ORDER — FENTANYL CITRATE (PF) 100 MCG/2ML IJ SOLN
INTRAMUSCULAR | Status: AC
  Filled 2024-02-02: qty 2

## 2024-02-02 MED ORDER — MIDAZOLAM HCL 2 MG/2ML IJ SOLN
INTRAMUSCULAR | Status: AC
  Filled 2024-02-02: qty 2

## 2024-02-02 MED ORDER — SODIUM CHLORIDE 0.9 % IV SOLN: 12.5000 mg | INTRAVENOUS | Status: DC | PRN

## 2024-02-02 MED ORDER — OXYCODONE HCL 5 MG PO TABS: 5.0000 mg | ORAL_TABLET | Freq: Four times a day (QID) | ORAL | 0 refills | Status: AC | PRN

## 2024-02-02 MED ORDER — OXYCODONE HCL 5 MG PO TABS: 5.0000 mg | ORAL_TABLET | Freq: Once | ORAL | Status: DC | PRN

## 2024-02-02 MED ORDER — MIDAZOLAM HCL 5 MG/5ML IJ SOLN
INTRAMUSCULAR | Status: DC | PRN
  Administered 2024-02-02: 2 mg via INTRAVENOUS

## 2024-02-02 MED ORDER — ONDANSETRON HCL 4 MG/2ML IJ SOLN
INTRAMUSCULAR | Status: DC | PRN
  Administered 2024-02-02: 4 mg via INTRAVENOUS

## 2024-02-02 MED ORDER — LIDOCAINE 2% (20 MG/ML) 5 ML SYRINGE
INTRAMUSCULAR | Status: DC | PRN
  Administered 2024-02-02: 40 mg via INTRAVENOUS

## 2024-02-02 MED ORDER — LACTATED RINGERS IV SOLN
INTRAVENOUS | Status: DC
Start: 2024-02-02 — End: 2024-02-02

## 2024-02-02 MED ORDER — KETOROLAC TROMETHAMINE 30 MG/ML IJ SOLN
INTRAMUSCULAR | Status: DC | PRN
  Administered 2024-02-02: 30 mg via INTRAVENOUS

## 2024-02-02 MED ORDER — LACTATED RINGERS IV SOLN: INTRAVENOUS | Status: DC | PRN

## 2024-02-02 MED ORDER — 0.9 % SODIUM CHLORIDE (POUR BTL) OPTIME
TOPICAL | Status: DC | PRN
  Administered 2024-02-02: 120 mL

## 2024-02-02 SURGICAL SUPPLY — 29 items
BLADE SURG 15 STRL LF DISP TIS (BLADE) ×1 IMPLANT
BNDG COMPR ESMARK 4X3 LF (GAUZE/BANDAGES/DRESSINGS) ×1 IMPLANT
BNDG ELASTIC 2INX 5YD STR LF (GAUZE/BANDAGES/DRESSINGS) ×1 IMPLANT
CHLORAPREP W/TINT 26 (MISCELLANEOUS) ×1 IMPLANT
CORD BIPOLAR FORCEPS 12FT (ELECTRODE) ×1 IMPLANT
COVER BACK TABLE 60X90IN (DRAPES) ×1 IMPLANT
CUFF TOURN SGL QUICK 18X4 (TOURNIQUET CUFF) IMPLANT
CUFF TRNQT CYL 24X4X16.5-23 (TOURNIQUET CUFF) IMPLANT
DRAPE EXTREMITY T 121X128X90 (DISPOSABLE) ×1 IMPLANT
DRAPE SURG 17X23 STRL (DRAPES) ×1 IMPLANT
GAUZE SPONGE 4X4 12PLY STRL (GAUZE/BANDAGES/DRESSINGS) IMPLANT
GAUZE XEROFORM 1X8 LF (GAUZE/BANDAGES/DRESSINGS) ×1 IMPLANT
GLOVE BIO SURGEON STRL SZ7 (GLOVE) ×1 IMPLANT
GLOVE BIOGEL PI IND STRL 7.0 (GLOVE) ×1 IMPLANT
GLOVE BIOGEL PI IND STRL 7.5 (GLOVE) IMPLANT
GOWN STRL REUS W/ TWL LRG LVL3 (GOWN DISPOSABLE) ×2 IMPLANT
GOWN STRL REUS W/ TWL XL LVL3 (GOWN DISPOSABLE) IMPLANT
NDL HYPO 25X1 1.5 SAFETY (NEEDLE) ×1 IMPLANT
NEEDLE HYPO 25X1 1.5 SAFETY (NEEDLE) ×1 IMPLANT
NS IRRIG 1000ML POUR BTL (IV SOLUTION) ×1 IMPLANT
PACK BASIN DAY SURGERY FS (CUSTOM PROCEDURE TRAY) ×1 IMPLANT
SHEET MEDIUM DRAPE 40X70 STRL (DRAPES) ×1 IMPLANT
SUT ETHILON 4 0 PS 2 18 (SUTURE) ×1 IMPLANT
SUT MNCRL AB 4-0 PS2 18 (SUTURE) IMPLANT
SUT VIC AB 4-0 PS2 18 (SUTURE) IMPLANT
SYR BULB EAR ULCER 3OZ GRN STR (SYRINGE) ×1 IMPLANT
SYR CONTROL 10ML LL (SYRINGE) ×1 IMPLANT
TOWEL GREEN STERILE FF (TOWEL DISPOSABLE) ×1 IMPLANT
UNDERPAD 30X36 HEAVY ABSORB (UNDERPADS AND DIAPERS) ×1 IMPLANT

## 2024-02-02 NOTE — H&P (Signed)
 HAND SURGERY   HPI: Patient is a 53 y.o. male who presents with right middle finger stenosing tenosynovitis.  Patient denies any changes to their medical history or new systemic symptoms today.    Past Medical History:  Diagnosis Date   Brain abscess    Head injury, closed, with brief LOC (HCC) 04/20/2010   Headache(784.0)    has subdural hemotoma   No pertinent past medical history    Subdural hematoma (HCC)    Past Surgical History:  Procedure Laterality Date   CRANIOTOMY  03/02/2011   Procedure: CRANIOTOMY HEMATOMA EVACUATION SUBDURAL;  Surgeon: Lynwood JONELLE Mill;  Location: [facility];  Service: Neurosurgery;  Laterality: Left;   CRANIOTOMY  03/31/2011   Procedure: CRANIOTOMY HEMATOMA EVACUATION SUBDURAL;  Surgeon: Lynwood JONELLE Mill;  Location: [facility];  Service: Neurosurgery;  Laterality: Left;  Left Craniectomy for Subdural Abcess   CRANIOTOMY  02/04/2012   Procedure: CRANIOTOMY BONE FLAP/PROSTHETIC PLATE;  Surgeon: Lynwood JONELLE Mill, MD;  Location: [facility];  Service: Neurosurgery;  Laterality: Left;  Cranioplasty with Cranila mesh implant   SHOULDER OPEN ROTATOR CUFF REPAIR     2010  left shoulder   Social History   Socioeconomic History   Marital status: Married    Spouse name: Not on file   Number of children: Not on file   Years of education: Not on file   Highest education level: Not on file  Occupational History   Not on file  Tobacco Use   Smoking status: Never   Smokeless tobacco: Never   Tobacco comments:    Quit in 1998  Vaping Use   Vaping status: Never Used  Substance and Sexual Activity   Alcohol use: Yes    Alcohol/week: 2.0 standard drinks of alcohol    Types: 2 Cans of beer per week   Drug use: No   Sexual activity: Yes  Other Topics Concern   Not on file  Social History Narrative   Not on file   Social Drivers of Health   Financial Resource Strain: Not on file  Food Insecurity: Not on file  Transportation Needs: Not on file   Physical Activity: Not on file  Stress: Not on file  Social Connections: Unknown (08/29/2021)   Received from [facility]   Social Network    Social Network: Not on file   Family History  Family history unknown: Yes   - negative except otherwise stated in the family history section No Known Allergies Prior to Admission medications   Medication Sig Start Date End Date Taking? Authorizing Provider  diclofenac  (VOLTAREN ) 75 MG EC tablet Take 1 tablet (75 mg total) by mouth 2 (two) times daily as needed. 12/07/22   Raspet, Erin K, PA-C  lidocaine  (LIDODERM ) 5 % Place 1 patch onto the skin daily. Remove & Discard patch within 12 hours or as directed by MD 12/07/22   Raspet, Rocky POUR, PA-C  methocarbamol  (ROBAXIN ) 500 MG tablet Take 1 tablet (500 mg total) by mouth 2 (two) times daily. 12/07/22   Raspet, Erin K, PA-C   No results found. - Positive ROS: All other systems have been reviewed and were otherwise negative with the exception of those mentioned in the HPI and as above.  Physical Exam: General: No acute distress, resting comfortably Cardiovascular: BUE warm and well perfused, normal rate Respiratory: Normal WOB on RA Skin: Warm and dry Neurologic: Sensation intact distally Psychiatric: Patient is at baseline mood and affect  Right Upper Extremity  TTP over middle finger A1 pulley.  There is palpable and visible locking/catching.  He has full AROM of remaining fingers.  SILT m/u/r distributions.  Fingers pink and well perfused.     Assessment: 53 yo M w/ right middle finger stenosing tenosynovitis  Plan: OR today for right middle finger A1 pulley release. We again reviewed the risks of surgery which include bleeding, infection, damage to neurovascular structures, persistent symptoms, scar sensitivity, need for additional surgery.  Informed consent was signed.  All questions were answered.   Bebe Galla, M.D. [facility] 9:36 AM

## 2024-02-02 NOTE — Discharge Instructions (Addendum)
 Don Palmer, M.D. Hand Surgery  POST-OPERATIVE DISCHARGE INSTRUCTIONS   PRESCRIPTIONS: - You may have been given a prescription to be taken as directed for post-operative pain control.  You may also take over the counter ibuprofen /aleve  and tylenol  for pain. Take this as directed on the packaging. Do not exceed 3000 mg tylenol /acetaminophen  in 24 hours.  Ibuprofen  600-800 mg (3-4) tablets by mouth every 6 hours as needed for pain.   OR  Aleve  2 tablets by mouth every 12 hours (twice daily) as needed for pain.   AND/OR  Tylenol  1000 mg (2 tablets) every 8 hours as needed for pain.  - Please use your pain medication carefully, as refills are limited and you may not be provided with one.  As stated above, please use over the counter pain medicine - it will also be helpful with decreasing your swelling.    ANESTHESIA: -After your surgery, post-surgical discomfort or pain is likely. This discomfort can last several days to a few weeks. At certain times of the day your discomfort may be more intense.   Did you receive a nerve block?   - A nerve block can provide pain relief for one hour to two days after your surgery. As long as the nerve block is working, you will experience little or no sensation in the area the surgeon operated on.  - As the nerve block wears off, you will begin to experience pain or discomfort. It is very important that you begin taking your prescribed pain medication before the nerve block fully wears off. Treating your pain at the first sign of the block wearing off will ensure your pain is better controlled and more tolerable when full-sensation returns. Do not wait until the pain is intolerable, as the medicine will be less effective. It is better to treat pain in advance than to try and catch up.   General Anesthesia:  If you did not receive a nerve block during your surgery, you will need to start taking your pain medication shortly after your surgery and  should continue to do so as prescribed by your surgeon.     ICE AND ELEVATION: - You may use ice for the first 48-72 hours, but it is not critical.   - Motion of your fingers is very important to decrease the swelling.  - Elevation, as much as possible for the next 48 hours, is critical for decreasing swelling as well as for pain relief. Elevation means when you are seated or lying down, you hand should be at or above your heart. When walking, the hand needs to be at or above the level of your elbow.  - If the bandage gets too tight, it may need to be loosened. Please contact our office and we will instruct you in how to do this.    SURGICAL BANDAGES:  - Keep your dressing and/or splint clean and dry at all times.  You can remove your dressing 7 days from now and change with a dry dressing or Band-Aids as needed thereafter. - You may place a plastic bag over your bandage to shower, but be careful, do not get your bandages wet.  - After the bandages have been removed, it is OK to get the stitches wet in a shower or with hand washing. Do Not soak or submerge the wound yet. Please do not use lotions or creams on the stitches.      HAND THERAPY:  - You may not need any. If you  do, we will begin this at your follow up visit in the clinic.    ACTIVITY AND WORK: - You are encouraged to move any fingers which are not in the bandage.  - Light use of the fingers is allowed to assist the other hand with daily hygiene and eating, but strong gripping or lifting is often uncomfortable and should be avoided.  - You might miss a variable period of time from work and hopefully this issue has been discussed prior to surgery. You may not do any heavy work with your affected hand for about 2 weeks.    [facility] Second Floor, [address] [place], [place] [address] [phone]    Post Anesthesia Home Care Instructions  Activity: Get plenty of rest for the remainder of the day. A  responsible individual must stay with you for 24 hours following the procedure.  For the next 24 hours, DO NOT: -Drive a car -Advertising copywriter -Drink alcoholic beverages -Take any medication unless instructed by your physician -Make any legal decisions or sign important papers.  Meals: Start with liquid foods such as gelatin or soup. Progress to regular foods as tolerated. Avoid greasy, spicy, heavy foods. If nausea and/or vomiting occur, drink only clear liquids until the nausea and/or vomiting subsides. Call your physician if vomiting continues.  Special Instructions/Symptoms: Your throat may feel dry or sore from the anesthesia or the breathing tube placed in your throat during surgery. If this causes discomfort, gargle with warm salt water. The discomfort should disappear within 24 hours.  If you had a scopolamine patch placed behind your ear for the management of post- operative nausea and/or vomiting:  1. The medication in the patch is effective for 72 hours, after which it should be removed.  Wrap patch in a tissue and discard in the trash. Wash hands thoroughly with soap and water. 2. You may remove the patch earlier than 72 hours if you experience unpleasant side effects which may include dry mouth, dizziness or visual disturbances. 3. Avoid touching the patch. Wash your hands with soap and water after contact with the patch.     No ibuprofen /NSAIDS until after 7pm No tylenol  until after 335pm

## 2024-02-02 NOTE — Transfer of Care (Signed)
 Immediate Anesthesia Transfer of Care Note  Patient: Don Palmer  Procedure(s) Performed: Right middle finger A1 pulley release (Right: Hand)  Patient Location: PACU  Anesthesia Type:MAC  Level of Consciousness: awake, oriented, and patient cooperative  Airway & Oxygen Therapy: Patient Spontanous Breathing  Post-op Assessment: Report given to RN and Post -op Vital signs reviewed and stable  Post vital signs: Reviewed and stable  Last Vitals:  Vitals Value Taken Time  BP 108/50 02/02/24 11:01  Temp 36.4 C 02/02/24 11:01  Pulse 49 02/02/24 11:03  Resp 14 02/02/24 11:03  SpO2 95 % 02/02/24 11:03  Vitals shown include unfiled device data.  Last Pain:  Vitals:   02/02/24 0936  TempSrc: Temporal  PainSc: 5          Complications: No notable events documented.

## 2024-02-02 NOTE — Anesthesia Preprocedure Evaluation (Addendum)
 Anesthesia Evaluation  Patient identified by MRN, date of birth, ID band Patient awake    Reviewed: Allergy & Precautions, NPO status , Patient's Chart, lab work & pertinent test results  History of Anesthesia Complications Negative for: history of anesthetic complications  Airway Mallampati: II  TM Distance: >3 FB Neck ROM: Full    Dental  (+) Dental Advisory Given, Chipped   Pulmonary neg pulmonary ROS   Pulmonary exam normal        Cardiovascular negative cardio ROS Normal cardiovascular exam     Neuro/Psych  Headaches  Hx traumatic SDH    negative psych ROS   GI/Hepatic negative GI ROS, Neg liver ROS,,,  Endo/Other  negative endocrine ROS    Renal/GU negative Renal ROS     Musculoskeletal negative musculoskeletal ROS (+)    Abdominal   Peds  Hematology negative hematology ROS (+)   Anesthesia Other Findings   Reproductive/Obstetrics                              Anesthesia Physical Anesthesia Plan  ASA: 1  Anesthesia Plan: MAC   Post-op Pain Management: Tylenol  PO (pre-op)* and Minimal or no pain anticipated   Induction:   PONV Risk Score and Plan: 1 and Propofol  infusion and Treatment may vary due to age or medical condition  Airway Management Planned: Natural Airway and Simple Face Mask  Additional Equipment: None  Intra-op Plan:   Post-operative Plan:   Informed Consent: I have reviewed the patients History and Physical, chart, labs and discussed the procedure including the risks, benefits and alternatives for the proposed anesthesia with the patient or authorized representative who has indicated his/her understanding and acceptance.       Plan Discussed with: CRNA and Anesthesiologist  Anesthesia Plan Comments:          Anesthesia Quick Evaluation

## 2024-02-02 NOTE — Op Note (Signed)
   Date of Surgery: 02/02/2024  INDICATIONS: Patient is a 53 y.o.-year-old male with right middle finger stenosing tenosynovitis.  Risks, benefits, and alternatives to surgery were again discussed with the patient in the preoperative area. The patient wishes to proceed with surgery.  Informed consent was signed after our discussion.   PREOPERATIVE DIAGNOSIS:  Right middle trigger finger  POSTOPERATIVE DIAGNOSIS: Same.  PROCEDURE:  Right middle finger A1 pulley release   SURGEON: Carlin Galla, M.D.  ASSIST:   ANESTHESIA:  Local + MAC  IV FLUIDS AND URINE: See anesthesia.  ESTIMATED BLOOD LOSS: <5 mL.  IMPLANTS: * No implants in log *   DRAINS: None  COMPLICATIONS: None noted  DESCRIPTION OF PROCEDURE: The patient was met in the preoperative holding area where the surgical site was marked and the informed consent form was signed.  The patient was then brought back to the operating room and remained on the stretcher.  A hand table was placed adjacent to the operative extremity and locked into place.  A tourniquet was placed on the right forearm.  A formal timeout was performed to confirm that this was the correct patient, surgical side, surgical site, and surgical procedure.  All were present and in agreement. Following formal timeout, a local block was performed using 10 mL of 0.25% plain marcaine.  The right upper extremity was then prepped and draped in the usual and sterile fashion.    The limb was exsanguinated and the tourniquet inflated to 250 mmHg.  A longitudinal incision was made over the middle finger A1 pulley.  The skin was incised.  Blunt dissection was used to identify the A1 pulley.  Two Ragnell retractors were placed on the radial and ulnar sides of the pulley to protect the respective neurovascular bundles.  A third Ragnell was placed at the distal aspect of the wound.  The A1 pulley was clearly identified.  Under direct visualization, the pulley was entered  sharply using a 15 blade.  Tenotomy scissors were used to complete the pulley release distally to the level of the A2 pulley.  The distal retractor was then placed in the proximal aspect of the wound.  Under direct visualization, the proximal aspect of the A1 pulley was completely released.   Following satisfactory A1 pulley release, the patient was reversed from sedation and asked to fully flex and extend the involved finger.  There was no catching or triggering present.  The tourniquet was let down and hemostasis achieved with direct pressure and bipolar electrocautery.  The wound was then thoroughly irrigated.  It was closed using 4-0 vicryl rapide sutures in a horizontal mattress fashion.  The wound was dressed with xeroform, 4x4, and an ace wrap.   The patient was reversed from sedation.   All counts were correct x 2 at the end of the procedure.  The patient was then taken to the PACU in stable condition.   POSTOPERATIVE PLAN: He will be discharged to home with appropriate pain medication and discharge instructions.  I'll see him in 10-14 days for his first postop visit.   Carlin Galla, MD 11:00 AM

## 2024-02-02 NOTE — Anesthesia Postprocedure Evaluation (Signed)
 Anesthesia Post Note  Patient: Don Palmer  Procedure(s) Performed: Right middle finger A1 pulley release (Right: Hand)     Patient location during evaluation: PACU Anesthesia Type: MAC Level of consciousness: awake and alert Pain management: pain level controlled Vital Signs Assessment: post-procedure vital signs reviewed and stable Respiratory status: spontaneous breathing, nonlabored ventilation and respiratory function stable Cardiovascular status: stable and blood pressure returned to baseline Anesthetic complications: no   No notable events documented.  Last Vitals:  Vitals:   02/02/24 1115 02/02/24 1126  BP: (!) 106/52 101/65  Pulse: (!) 46 (!) 48  Resp: 14 20  Temp:  (!) 36.2 C  SpO2: 96% 98%    Last Pain:  Vitals:   02/02/24 1126  TempSrc: Temporal  PainSc: 0-No pain                 Debby FORBES Like

## 2024-02-02 NOTE — Anesthesia Procedure Notes (Addendum)
 Procedure Name: MAC Date/Time: 02/02/2024 11:34 AM  Performed by: Denton Niels CROME, CRNAPre-anesthesia Checklist: Patient identified, Emergency Drugs available, Suction available, Patient being monitored and Timeout performed Patient Re-evaluated:Patient Re-evaluated prior to induction Oxygen Delivery Method: Simple face mask Induction Type: IV induction Placement Confirmation: positive ETCO2 Dental Injury: Teeth and Oropharynx as per pre-operative assessment

## 2024-02-02 NOTE — Interval H&P Note (Signed)
 History and Physical Interval Note:  02/02/2024 9:38 AM  Don Palmer  has presented today for surgery, with the diagnosis of Trigger finger, right middle finger.  The various methods of treatment have been discussed with the patient and family. After consideration of risks, benefits and other options for treatment, the patient has consented to  Procedure(s): RELEASE, A1 PULLEY, FOR TRIGGER FINGER (Right) as a surgical intervention.  The patient's history has been reviewed, patient examined, no change in status, stable for surgery.  I have reviewed the patient's chart and labs.  Questions were answered to the patient's satisfaction.     [name]

## 2024-02-03 ENCOUNTER — Encounter (HOSPITAL_BASED_OUTPATIENT_CLINIC_OR_DEPARTMENT_OTHER): Payer: Self-pay | Admitting: Orthopedic Surgery
